# Patient Record
Sex: Female | Born: 1985 | Race: White | Hispanic: No | Marital: Married | Smoking: Never smoker
Health system: Southern US, Community
[De-identification: ages and names within clinical notes are randomized; demographics above are authoritative.]

## PROBLEM LIST (undated history)

## (undated) ENCOUNTER — Inpatient Hospital Stay (HOSPITAL_COMMUNITY): Payer: Self-pay

## (undated) DIAGNOSIS — B3731 Acute candidiasis of vulva and vagina: Secondary | ICD-10-CM

## (undated) DIAGNOSIS — B373 Candidiasis of vulva and vagina: Secondary | ICD-10-CM

## (undated) DIAGNOSIS — Z789 Other specified health status: Secondary | ICD-10-CM

## (undated) HISTORY — PX: BARIATRIC SURGERY: SHX1103

---

## 2011-05-04 HISTORY — PX: BARIATRIC SURGERY: SHX1103

## 2013-07-11 ENCOUNTER — Encounter: Payer: Self-pay | Admitting: *Deleted

## 2013-08-02 ENCOUNTER — Encounter: Payer: Self-pay | Admitting: Obstetrics & Gynecology

## 2013-08-02 ENCOUNTER — Ambulatory Visit (INDEPENDENT_AMBULATORY_CARE_PROVIDER_SITE_OTHER): Payer: BC Managed Care – PPO | Admitting: Obstetrics & Gynecology

## 2013-08-02 VITALS — BP 109/79 | Temp 98.1°F | Ht 60.0 in | Wt 142.2 lb

## 2013-08-02 DIAGNOSIS — Z3400 Encounter for supervision of normal first pregnancy, unspecified trimester: Secondary | ICD-10-CM | POA: Insufficient documentation

## 2013-08-02 DIAGNOSIS — O99840 Bariatric surgery status complicating pregnancy, unspecified trimester: Secondary | ICD-10-CM

## 2013-08-02 LAB — POCT URINALYSIS DIP (DEVICE)
Bilirubin Urine: NEGATIVE
Glucose, UA: NEGATIVE mg/dL
HGB URINE DIPSTICK: NEGATIVE
Ketones, ur: NEGATIVE mg/dL
Leukocytes, UA: NEGATIVE
NITRITE: NEGATIVE
Protein, ur: NEGATIVE mg/dL
Specific Gravity, Urine: 1.025 (ref 1.005–1.030)
UROBILINOGEN UA: 1 mg/dL (ref 0.0–1.0)
pH: 7 (ref 5.0–8.0)

## 2013-08-02 NOTE — Progress Notes (Signed)
Nutrition note: 1st visit consult Pt has h/o bariatric surgery in 2013 - pt reports she was 330# before surgery & her lowest was 130# prior to pregnancy. Pt stated she is not following any dietary restrictions except watches her sugar intake. Pt has gained 12.2# @ 2242w2d, which is > expected.  Pt reports eating 3 meals & 2 snacks/d. Pt is taking a PNV. Pt reports having some nausea but no heartburn. Pt is allergic to mangos. Pt stated MD plans to prescribe anti-nausea meds. Pt reports walking. Pt received verbal & written education on general nutrition during pregnancy. Provided handout on recommendations for micronutrients after bariatric surgery and pregnancy - discussed with patient that she may need an additional calcium supplement (recommendation is 1000-1500 mg/d), vitamin B12 (recommendation is 500 mcg/d), and iron (recommendation is 50-100 mg ferrous iron) - encouraged pt to talk to MD @ future visit about need for additional supplements.  Discussed wt gain goals of 15-25# or 0.6#/wk. Pt expressed concern about gaining wt back - encouraged pt to concentrate on 0.6#/wk, compare intake to recommendations for each food group, and how BF can help her lose wt after pregnancy. Pt agrees to continue taking PNV & monitor intake. Pt does not have WIC but plans [place] apply in [place]. Pt plans to BF. F/u in 4-6 wks Blondell RevealLaura Jozelyn Kuwahara, MS, RD, LDN, [facility]

## 2013-08-02 NOTE — Progress Notes (Signed)
P= 69 C/o of occasional cramping and N/V. Requests prescription for nausea. Discussed appropriate weight gain based on BMI (15-25lb); pt. Verbalized understanding.  New OB packet given. OTC list of appropriate medications given to pt.  New OB labs today. Pt. [facility] [place]  Pt. Declines flu today, information sheet given and pt. Will decide at next visit.

## 2013-08-02 NOTE — Progress Notes (Signed)
Maternal Fetal Medicine genetic counseling appointment scheduled for 08/03/13 at 10 am.  Release of information signed for [facility] [place] to get pap smear results.  [facility], patient did not have pap there.  Will need pap at next visit.

## 2013-08-03 ENCOUNTER — Ambulatory Visit (HOSPITAL_COMMUNITY)
Admission: RE | Admit: 2013-08-03 | Discharge: 2013-08-03 | Disposition: A | Discharge status: Home / Self Care | Payer: BC Managed Care – PPO | Source: Ambulatory Visit | Attending: Obstetrics & Gynecology | Admitting: Obstetrics & Gynecology

## 2013-08-03 DIAGNOSIS — O352XX0 Maternal care for (suspected) hereditary disease in fetus, not applicable or unspecified: Secondary | ICD-10-CM | POA: Insufficient documentation

## 2013-08-03 LAB — PRESCRIPTION MONITORING PROFILE (19 PANEL)
AMPHETAMINE/METH: NEGATIVE ng/mL
Barbiturate Screen, Urine: NEGATIVE ng/mL
Benzodiazepine Screen, Urine: NEGATIVE ng/mL
Buprenorphine, Urine: NEGATIVE ng/mL
CARISOPRODOL, URINE: NEGATIVE ng/mL
CREATININE, URINE: 165.42 mg/dL (ref >20.0)
Cannabinoid Scrn, Ur: NEGATIVE ng/mL
Cocaine Metabolites: NEGATIVE ng/mL
Fentanyl, Ur: NEGATIVE ng/mL
MDMA URINE: NEGATIVE ng/mL
METHADONE SCREEN, URINE: NEGATIVE ng/mL
Meperidine, Ur: NEGATIVE ng/mL
Methaqualone: NEGATIVE ng/mL
NITRITES URINE, INITIAL: NEGATIVE ug/mL
OXYCODONE SCRN UR: NEGATIVE ng/mL
Opiate Screen, Urine: NEGATIVE ng/mL
PROPOXYPHENE: NEGATIVE ng/mL
Phencyclidine, Ur: NEGATIVE ng/mL
TAPENTADOLUR: NEGATIVE ng/mL
Tramadol Scrn, Ur: NEGATIVE ng/mL
Zolpidem, Urine: NEGATIVE ng/mL
pH, Initial: 7.1 pH (ref 4.5–8.9)

## 2013-08-03 LAB — OBSTETRIC PANEL
Antibody Screen: NEGATIVE
BASOS PCT: 0 % (ref 0–1)
Basophils Absolute: 0 10*3/uL (ref 0.0–0.1)
EOS ABS: 0 10*3/uL (ref 0.0–0.7)
Eosinophils Relative: 0 % (ref 0–5)
HCT: 32.5 % — ABNORMAL LOW (ref 36.0–46.0)
HEP B S AG: NEGATIVE
Hemoglobin: 10.5 g/dL — ABNORMAL LOW (ref 12.0–15.0)
LYMPHS ABS: 1.6 10*3/uL (ref 0.7–4.0)
Lymphocytes Relative: 27 % (ref 12–46)
MCH: 23.3 pg — AB (ref 26.0–34.0)
MCHC: 32.3 g/dL (ref 30.0–36.0)
MCV: 72.1 fL — ABNORMAL LOW (ref 78.0–100.0)
Monocytes Absolute: 0.4 10*3/uL (ref 0.1–1.0)
Monocytes Relative: 7 % (ref 3–12)
NEUTROS ABS: 3.8 10*3/uL (ref 1.7–7.7)
NEUTROS PCT: 66 % (ref 43–77)
PLATELETS: 212 10*3/uL (ref 150–400)
RBC: 4.51 MIL/uL (ref 3.87–5.11)
RDW: 19.5 % — ABNORMAL HIGH (ref 11.5–15.5)
Rh Type: POSITIVE
Rubella: 0.54 Index (ref <0.90)
WBC: 5.8 10*3/uL (ref 4.0–10.5)

## 2013-08-03 LAB — GC/CHLAMYDIA PROBE AMP
CT Probe RNA: NEGATIVE
GC PROBE AMP APTIMA: NEGATIVE

## 2013-08-03 LAB — HIV ANTIBODY (ROUTINE TESTING W REFLEX): HIV: NONREACTIVE

## 2013-08-03 NOTE — Progress Notes (Addendum)
Genetic Counseling  High-Risk Gestation Note  Appointment Date:  08/03/2013 Referred By: Woodroe Mode, MD Date of Birth:  Mar 06, 1986 Partner:  [name]    Pregnancy History: G2P0010 Estimated Date of Delivery: 01/22/14 Estimated Gestational Age: [redacted]w[redacted]d I met with [name], [name] for genetic counseling regarding available screening options for fetal aneuploidy.  [name] inquired about noninvasive prenatal screening (NIPS)/cell free fetal DNA (cffDNA) testing.  We discussed that the [employer] recommends that this technology be made available to women who have an increased risk for fetal aneuploidy (AMA, abnormal maternal serum screening/First trimester screening, abnormal ultrasound findings, or a family history of aneuploidy).  Studies that have assessed the performance of this technology have been primarily performed on women who are considered to have a "high risk" for fetal aneuploidy.  While there are some newer studies that have assessed the performance of NIPS in the low risk population, this data is currently limited.    We reviewed that NIPS/cffDNA testing analyzes cell free DNA (from the placenta) in maternal circulation for the presence of extra or missing fetal chromosome material.   We reviewed the sensitivity, specificity, PPV, NPV and false positive rates of this technology.  They understand that while this testing is highly accurate, it is not considered to be diagnostic.  They were counseled regarding the expense of NIPS, and that approximately $3000 is billed to the insurance provider.  The amount they will be responsible for, out of pocket, depends upon the specific plan and is subject to co-pay, co-insurance and/or deductible.  We discussed that since [name] does not have an indication for NIPS, a generic screening code would have to be used as a billing code.  She understands that she would likely  have to pay out of pocket for this testing.    [name] maternal serum Quad screening.  We discussed that the preliminary result was available for review today and is screen negative for Down syndrome, trisomy 18, and ONTDs.  We reviewed this technology including the sensitivity, specificity, and false positive rates.  Given that the preliminary result was wnl, this couple declined further screening.  [name] encouraged to call her primary OB to review the final results of her Quad screen next week.    Both family histories were reviewed and found to be contributory for [name] apparently isolated cleft lip.  We discussed that in normal embryological development, the fetal lip usually closes by 7-[redacted] weeks gestation and the fetal palate usually closes by [redacted] weeks gestation.  When parts of these structures do not fuse properly, cleft lip and/or palate (CL/P) results.  CL/P is twice as common in males as it is in females. The incidence of CL/P varies in different ethnic populations; it occurs in approximately 1 in 180,000Caucasian births.  In addition to ethnicity, other factors may increase the chance of a CL/P including some prenatal exposures, alcohol and drug use, cigarette smoking, or folic acid deficiency.  They were counseled that CL/P is most often an isolated condition, but can be present in combination with other birth defects possibly as part of a genetic syndrome. When there is no syndrome as the cause, then the cleft lip or palate is typically suspected to be caused by a combination of genetic and environmental factors (multifactorial inheritance).  In this case, the risk of recurrence is expected to be ~3-4%.  We discussed the availability  of a detailed ultrasound at ~[redacted] weeks gestation for visualization of the fetal lip.  They understand that ultrasound cannot identify all birth defects or genetic conditions.      The family histories were otherwise found to be  noncontributory for birth defects, mental retardation, and known genetic conditions. Without further information regarding the provided family history, an accurate genetic risk cannot be calculated. Further genetic counseling is warranted if more information is obtained.  [name] was provided with written information regarding cystic fibrosis (CF) including the carrier frequency and incidence in the Caucasian population, the availability of carrier testing and prenatal diagnosis if indicated.  In addition, we discussed that CF is routinely screened for as part of the [place] newborn screening panel.  She declined testing today.   [name] denied exposure to environmental toxins or chemical agents. She denied the use of alcohol, tobacco or street drugs. She denied significant viral illnesses during the course of her pregnancy.   I counseled this couple regarding the above risks and available options.  The approximate face-to-face time with the genetic counselor was 38 minutes.  Filbert Schilder, Ms Certified Genetic Counselor

## 2013-08-04 LAB — CULTURE, OB URINE
Colony Count: NO GROWTH
Organism ID, Bacteria: NO GROWTH

## 2013-08-06 LAB — AFP, QUAD SCREEN
AFP: 45.8 IU/mL
Age Alone: 1:924 {titer}
Curr Gest Age: 15.2 wks.days
Down Syndrome Scr Risk Est: <1:38500 {titer}
HCG, Total: 33306 m[IU]/mL
INH: 214.4 pg/mL
Interpretation-AFP: NEGATIVE
MOM FOR AFP: 1.62
MoM for INH: 1.07
MoM for hCG: 1.14
Open Spina bifida: NEGATIVE
Tri 18 Scr Risk Est: NEGATIVE
UE3 VALUE: 0.6 ng/mL
uE3 Mom: 1.67

## 2013-08-30 ENCOUNTER — Encounter: Payer: Self-pay | Admitting: Family Medicine

## 2013-08-30 ENCOUNTER — Encounter: Payer: BC Managed Care – PPO | Admitting: Family Medicine

## 2013-09-13 ENCOUNTER — Ambulatory Visit (INDEPENDENT_AMBULATORY_CARE_PROVIDER_SITE_OTHER): Payer: BC Managed Care – PPO | Admitting: Family Medicine

## 2013-09-13 VITALS — BP 118/85 | HR 79 | Wt 146.1 lb

## 2013-09-13 DIAGNOSIS — O99840 Bariatric surgery status complicating pregnancy, unspecified trimester: Secondary | ICD-10-CM

## 2013-09-13 DIAGNOSIS — Z3400 Encounter for supervision of normal first pregnancy, unspecified trimester: Secondary | ICD-10-CM

## 2013-09-13 LAB — POCT URINALYSIS DIP (DEVICE)
BILIRUBIN URINE: NEGATIVE
GLUCOSE, UA: NEGATIVE mg/dL
Hgb urine dipstick: NEGATIVE
KETONES UR: NEGATIVE mg/dL
LEUKOCYTES UA: NEGATIVE
Nitrite: NEGATIVE
PH: 7 (ref 5.0–8.0)
Protein, ur: NEGATIVE mg/dL
Specific Gravity, Urine: 1.02 (ref 1.005–1.030)
Urobilinogen, UA: 0.2 mg/dL (ref 0.0–1.0)

## 2013-09-13 NOTE — Progress Notes (Signed)
   Subjective:    [name] is a G2P0010 seen 08/02/13 for her first obstetrical visit.  Her obstetrical history is significant for obesity, gastric bypass. Patient does intend to breast feed. Pregnancy history fully reviewed.  Patient reports no complaints.  Filed Vitals:   08/02/13 0945 08/02/13 0951  BP: 109/79   Temp: 98.1 F (36.7 C)   Height:  5' (1.524 m)  Weight: 142 lb 3.2 oz (64.501 kg)     HISTORY: OB History  Gravida Para Term Preterm AB SAB TAB Ectopic Multiple Living  2 0 0 0 1 0 1       # Outcome Date GA Lbr Len/2nd Weight Sex Delivery Anes PTL Lv  2 CUR           1 TAB 2000             History reviewed. No pertinent past medical history. Past Surgical History  Procedure Laterality Date  . Bariatric surgery    . Bariatric surgery N/A 2013    sleave   Family History  Problem Relation Age of Onset  . Hypertension Mother   . Hyperlipidemia Mother   . Hypertension Father   . Hyperlipidemia Father      Exam    Uterus:     Pelvic Exam:    Perineum: Normal Perineum   Vulva: normal   Vagina:  normal mucosa   pH:     Cervix: no lesions   Adnexa: not evaluated   Bony Pelvis: average  System: Breast:  normal appearance, no masses or tenderness   Skin: normal coloration and turgor, no rashes    Neurologic: oriented, normal   Extremities: normal strength, tone, and muscle mass   HEENT thyroid without masses   Mouth/Teeth dental hygiene good   Neck supple   Cardiovascular: regular rate and rhythm   Respiratory:  appears well, vitals normal, no respiratory distress, acyanotic, normal RR, neck free of mass or lymphadenopathy, chest clear, no wheezing, crepitations, rhonchi, normal symmetric air entry   Abdomen: soft, non-tender; bowel sounds normal; no masses,  no organomegaly   Urinary: urethral meatus normal      Assessment:    Pregnancy: G2P0010 Patient Active Problem List   Diagnosis Date Noted  . Hereditary disease in family  possibly affecting fetus, affecting management of mother, antepartum condition or complication 08/03/2013  . Supervision of low-risk first pregnancy 08/02/2013  . Bariatric surgery status in pregnancy, antepartum 08/02/2013        Plan:     Initial labs drawn. Prenatal vitamins. Problem list reviewed and updated. Genetic Screening and was scheduled 08/03/13 Ultrasound discussed; fetal survey: 18-20 weeks.  Follow up in 4 weeks.   Adam PhenixJames G Arnold

## 2013-09-13 NOTE — Progress Notes (Signed)
Needs anatomy u/s scheduled ASAP Pap smear today Consider SW referral VE-1 cm external os, closed internally, thick, high

## 2013-09-13 NOTE — Progress Notes (Signed)
U/S scheduled 09/18/13 at 11 am.

## 2013-09-13 NOTE — Addendum Note (Signed)
Addended by: Adam PhenixARNOLD, Chriss Mannan G on: 09/13/2013 12:01 PM   Modules accepted: Kipp BroodSmartSet

## 2013-09-13 NOTE — Progress Notes (Signed)
Patient complains of headaches. Needs Pap today.  Needs ultrasound.

## 2013-09-18 ENCOUNTER — Other Ambulatory Visit: Payer: Self-pay | Admitting: Family Medicine

## 2013-09-18 ENCOUNTER — Encounter: Payer: Self-pay | Admitting: Family Medicine

## 2013-09-18 ENCOUNTER — Ambulatory Visit (HOSPITAL_COMMUNITY)
Admission: RE | Admit: 2013-09-18 | Discharge: 2013-09-18 | Disposition: A | Discharge status: Home / Self Care | Payer: BC Managed Care – PPO | Source: Ambulatory Visit | Attending: Family Medicine | Admitting: Family Medicine

## 2013-09-18 DIAGNOSIS — Z3400 Encounter for supervision of normal first pregnancy, unspecified trimester: Secondary | ICD-10-CM

## 2013-09-18 DIAGNOSIS — Z3689 Encounter for other specified antenatal screening: Secondary | ICD-10-CM | POA: Insufficient documentation

## 2013-09-18 IMAGING — US US OB COMP +14 WK
1 series · 12 of 28 positions shown · non-contrast
Comparison: none

[Series 1: us ob comp +14 wk · 12 of 121 slices shown]
[im 5/121]
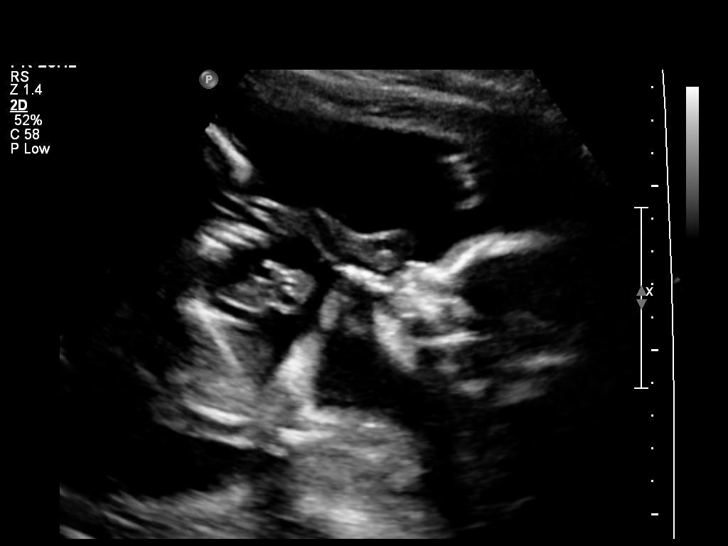
[im 14/121]
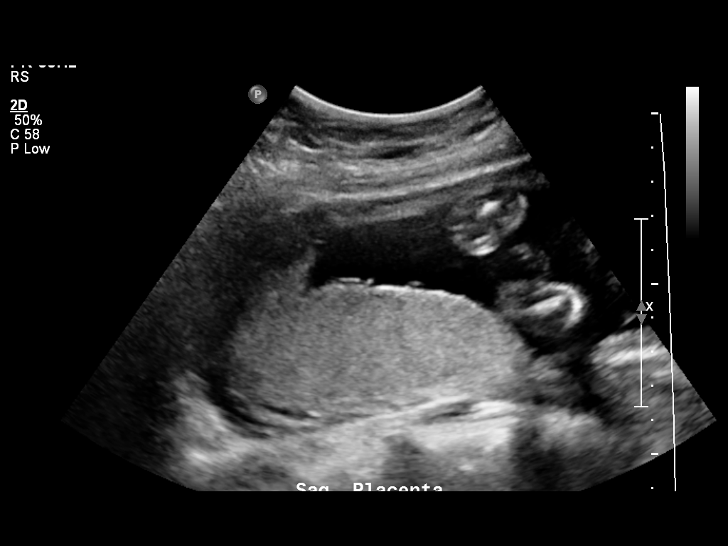
[im 23/121]
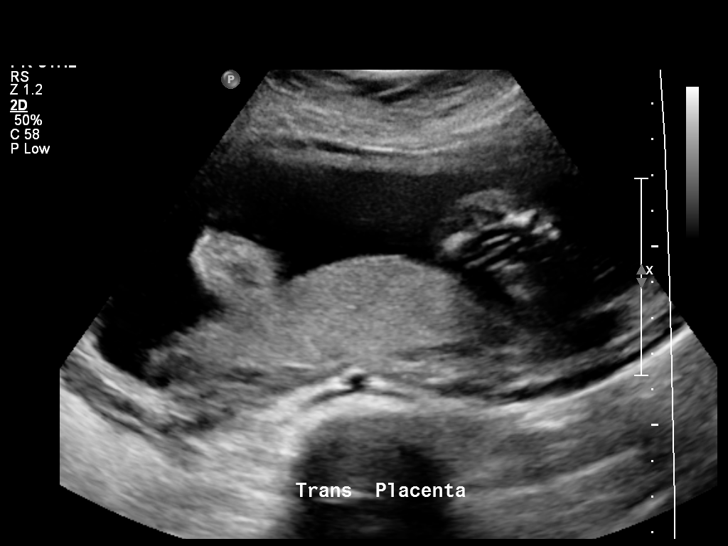
[im 36/121]
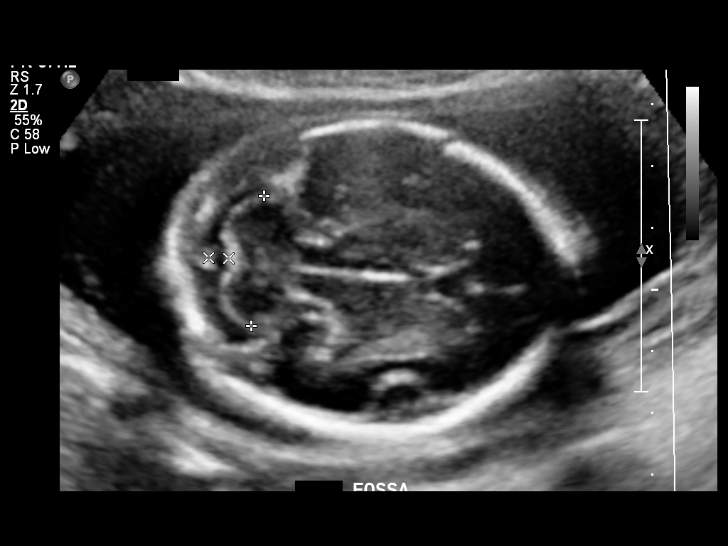
[im 45/121]
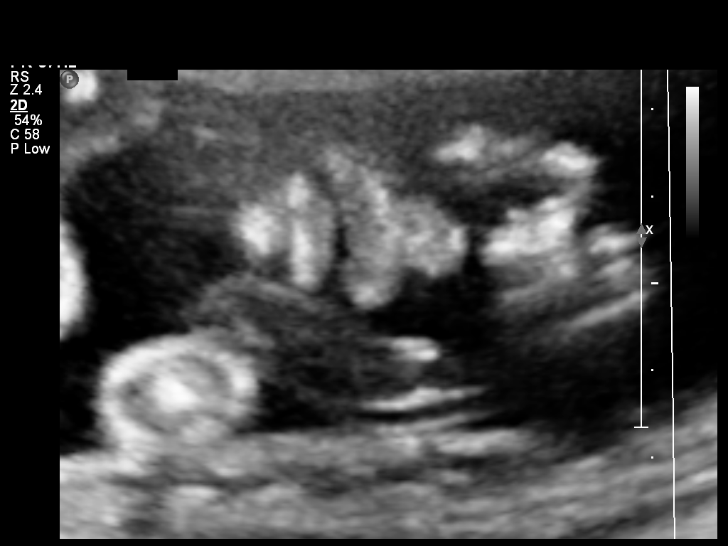
[im 54/121]
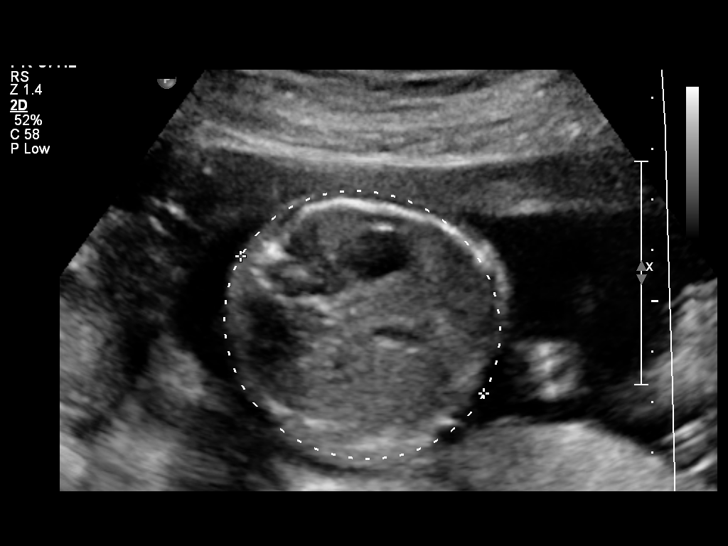
[im 67/121]
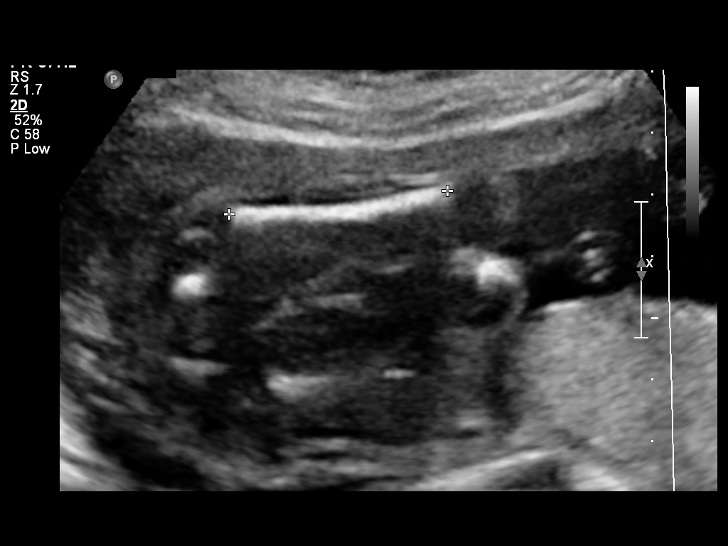
[im 76/121]
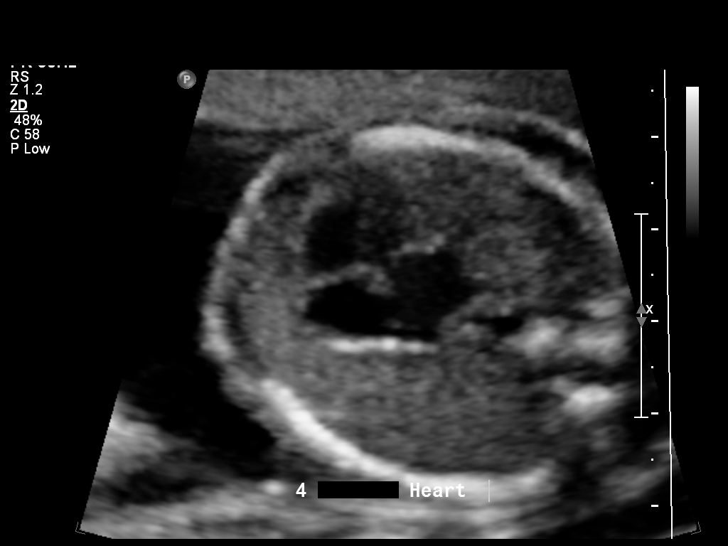
[im 85/121]
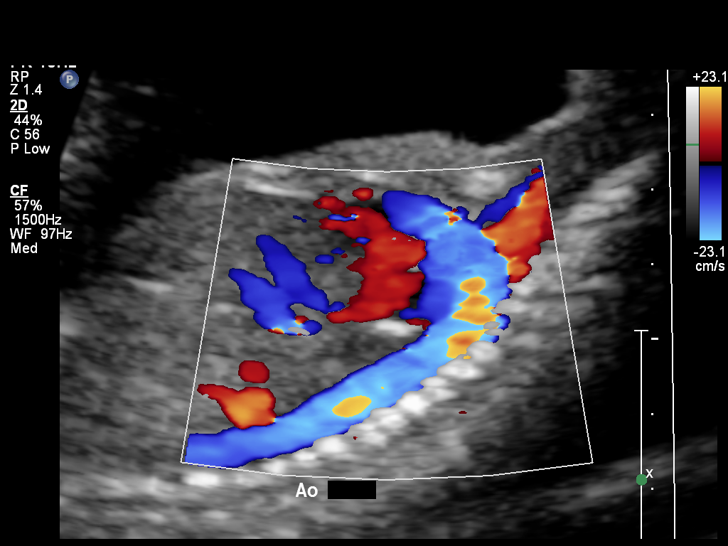
[im 98/121]
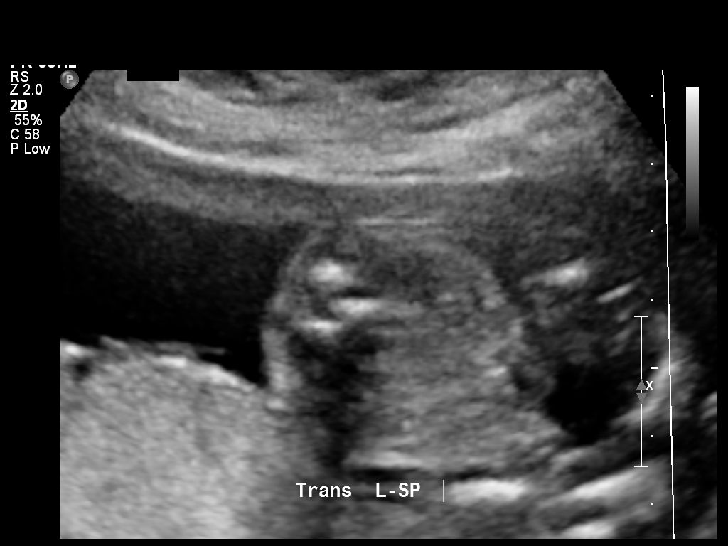
[im 107/121]
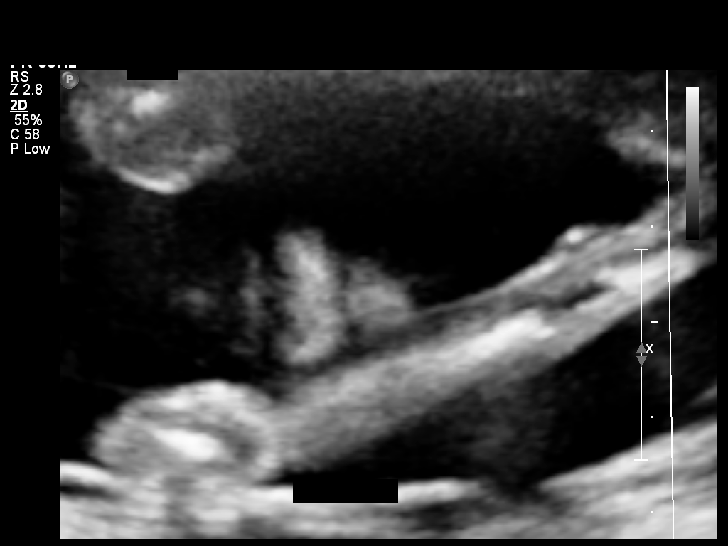
[im 116/121]
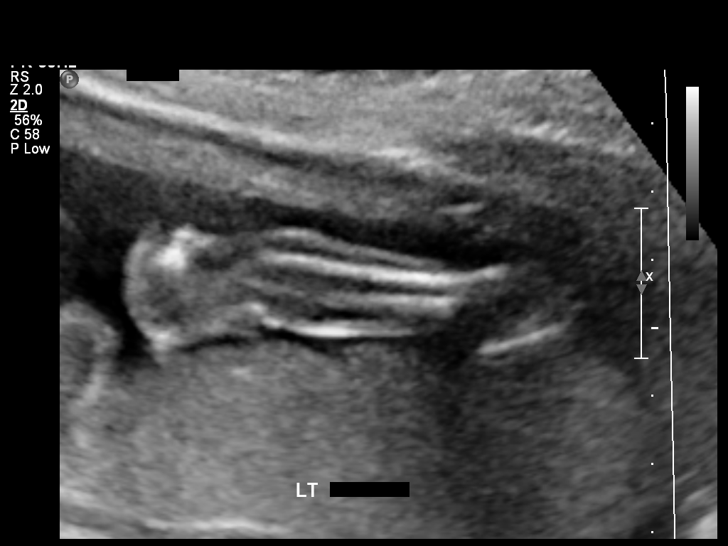

[12 of 28 positions shown; findings below may reference images not displayed]

OBSTETRICS REPORT
                      (Signed Final [DATE] [DATE])

Service(s) Provided

 US OB COMP + 14 WK                                    76805.1
Indications

 Basic anatomic survey                                 [QS]
Fetal Evaluation

 Num Of Fetuses:    1
 Fetal Heart Rate:  140                          bpm
 Cardiac Activity:  Observed
 Presentation:      Cephalic
 Placenta:          Fundal, above cervical os
 P. Cord            Visualized, central
 Insertion:

 Amniotic Fluid
 AFI FV:      Subjectively within normal limits
                                             Larg Pckt:     3.9  cm
Biometry

 BPD:     49.4  mm     G. Age:  20w 6d                CI:        69.78   70 - 86
                                                      FL/HC:      18.8   15.9 -

 HC:     188.7  mm     G. Age:  21w 1d       42  %    HC/AC:      1.15   1.06 -

 AC:     164.4  mm     G. Age:  21w 4d       55  %    FL/BPD:
 FL:      35.5  mm     G. Age:  21w 1d       44  %    FL/AC:      21.6   20 - 24
 HUM:     33.2  mm     G. Age:  21w 1d       49  %
 CER:     21.1  mm     G. Age:  20w 1d       25  %

 Est. FW:     416  gm    0 lb 15 oz      49  %
Gestational Age

 LMP:           22w 0d        Date:  [DATE]                 EDD:   [DATE]
 U/S Today:     21w 1d                                        EDD:   [DATE]
 Best:          21w 1d     Det. By:  U/S ([DATE])           EDD:   [DATE]
Anatomy

 Cranium:          Appears normal         Aortic Arch:      Not well visualized
 Fetal Cavum:      Appears normal         Ductal Arch:      Not well visualized
 Ventricles:       Appears normal         Diaphragm:        Appears normal
 Choroid Plexus:   Appears normal         Stomach:          Appears normal, left
                                                            sided
 Cerebellum:       Appears normal         Abdomen:          Appears normal
 Posterior Fossa:  Appears normal         Abdominal Wall:   Appears nml (cord
                                                            insert, abd wall)
 Nuchal Fold:      Not applicable (>20    Cord Vessels:     Appears normal (3
                   wks GA)                                  vessel cord)
 Face:             Appears normal         Kidneys:          Appear normal
                   (orbits and profile)
 Lips:             Appears normal         Bladder:          Appears normal
 Heart:            Appears normal         Spine:            Appears normal
                   (4CH, axis, and
                   situs)
 RVOT:             Appears normal         Lower             Appears normal
                                          Extremities:
 LVOT:             Appears normal         Upper             Appears normal
                                          Extremities:

 Other:  Fetus appears to be a female. Nasal bone visualized. Heels and 5th
         digit visualized.
Targeted Anatomy

 Fetal Central Nervous System
 Lat. Ventricles:  4.1                    Cisterna Magna:
Cervix Uterus Adnexa

 Cervical Length:    4.03     cm

 Cervix:       Normal appearance by transabdominal scan.
 Uterus:       No abnormality visualized.
 Left Ovary:    Within normal limits.
 Right Ovary:   Within normal limits.

 Adnexa:     No abnormality visualized.
Impression

 SIUP at 21+1 weeks
 Normal detailed fetal anatomy; limited views of arches
 Markers of aneuploidy: none
 Normal amniotic fluid volume
 Measurements consistent with prior US
Recommendations

 Follow-up as clinically indicated

## 2013-09-26 ENCOUNTER — Inpatient Hospital Stay (HOSPITAL_COMMUNITY)
Admission: AD | Admit: 2013-09-26 | Discharge: 2013-09-26 | Disposition: A | Discharge status: Home / Self Care | Payer: BC Managed Care – PPO | Source: Ambulatory Visit | Attending: Family Medicine | Admitting: Family Medicine

## 2013-09-26 ENCOUNTER — Encounter (HOSPITAL_COMMUNITY): Payer: Self-pay | Admitting: *Deleted

## 2013-09-26 DIAGNOSIS — Z3689 Encounter for other specified antenatal screening: Secondary | ICD-10-CM

## 2013-09-26 DIAGNOSIS — Z3492 Encounter for supervision of normal pregnancy, unspecified, second trimester: Secondary | ICD-10-CM

## 2013-09-26 DIAGNOSIS — O368190 Decreased fetal movements, unspecified trimester, not applicable or unspecified: Secondary | ICD-10-CM | POA: Insufficient documentation

## 2013-09-26 DIAGNOSIS — Z36 Encounter for antenatal screening of mother: Secondary | ICD-10-CM

## 2013-09-26 HISTORY — DX: Other specified health status: Z78.9

## 2013-09-26 NOTE — Discharge Instructions (Signed)
Second Trimester of Pregnancy The second trimester is from week 13 through week 28, months 4 through 6. The second trimester is often a time when you feel your best. Your body has also adjusted to being pregnant, and you begin to feel better physically. Usually, morning sickness has lessened or quit completely, you may have more energy, and you may have an increase in appetite. The second trimester is also a time when the fetus is growing rapidly. At the end of the sixth month, the fetus is about 9 inches long and weighs about 1 pounds. You will likely begin to feel the baby move (quickening) between 18 and 20 weeks of the pregnancy. BODY CHANGES Your body goes through many changes during pregnancy. The changes vary from woman to woman.   Your weight will continue to increase. You will notice your lower abdomen bulging out.  You may begin to get stretch marks on your hips, abdomen, and breasts.  You may develop headaches that can be relieved by medicines approved by your caregiver.  You may urinate more often because the fetus is pressing on your bladder.  You may develop or continue to have heartburn as a result of your pregnancy.  You may develop constipation because certain hormones are causing the muscles that push waste through your intestines to slow down.  You may develop hemorrhoids or swollen, bulging veins (varicose veins).  You may have back pain because of the weight gain and pregnancy hormones relaxing your joints between the bones in your pelvis and as a result of a shift in weight and the muscles that support your balance.  Your breasts will continue to grow and be tender.  Your gums may bleed and may be sensitive to brushing and flossing.  Dark spots or blotches (chloasma, mask of pregnancy) may develop on your face. This will likely fade after the baby is born.  A dark line from your belly button to the pubic area (linea nigra) may appear. This will likely fade after the  baby is born. WHAT TO EXPECT AT YOUR PRENATAL VISITS During a routine prenatal visit:  You will be weighed to make sure you and the fetus are growing normally.  Your blood pressure will be taken.  Your abdomen will be measured to track your baby's growth.  The fetal heartbeat will be listened to.  Any test results from the previous visit will be discussed. Your caregiver may ask you:  How you are feeling.  If you are feeling the baby move.  If you have had any abnormal symptoms, such as leaking fluid, bleeding, severe headaches, or abdominal cramping.  If you have any questions. Other tests that may be performed during your second trimester include:  Blood tests that check for:  Low iron levels (anemia).  Gestational diabetes (between 24 and 28 weeks).  Rh antibodies.  Urine tests to check for infections, diabetes, or protein in the urine.  An ultrasound to confirm the proper growth and development of the baby.  An amniocentesis to check for possible genetic problems.  Fetal screens for spina bifida and Down syndrome. HOME CARE INSTRUCTIONS   Avoid all smoking, herbs, alcohol, and unprescribed drugs. These chemicals affect the formation and growth of the baby.  Follow your caregiver's instructions regarding medicine use. There are medicines that are either safe or unsafe to take during pregnancy.  Exercise only as directed by your caregiver. Experiencing uterine cramps is a good sign to stop exercising.  Continue to eat regular,   healthy meals.  Wear a good support bra for breast tenderness.  Do not use hot tubs, steam rooms, or saunas.  Wear your seat belt at all times when driving.  Avoid raw meat, uncooked cheese, cat litter boxes, and soil used by cats. These carry germs that can cause birth defects in the baby.  Take your prenatal vitamins.  Try taking a stool softener (if your caregiver approves) if you develop constipation. Eat more high-fiber foods,  such as fresh vegetables or fruit and whole grains. Drink plenty of fluids to keep your urine clear or pale yellow.  Take warm sitz baths to soothe any pain or discomfort caused by hemorrhoids. Use hemorrhoid cream if your caregiver approves.  If you develop varicose veins, wear support hose. Elevate your feet for 15 minutes, 3 4 times a day. Limit salt in your diet.  Avoid heavy lifting, wear low heel shoes, and practice good posture.  Rest with your legs elevated if you have leg cramps or low back pain.  Visit your dentist if you have not gone yet during your pregnancy. Use a soft toothbrush to brush your teeth and be gentle when you floss.  A sexual relationship may be continued unless your caregiver directs you otherwise.  Continue to go to all your prenatal visits as directed by your caregiver. SEEK MEDICAL CARE IF:   You have dizziness.  You have mild pelvic cramps, pelvic pressure, or nagging pain in the abdominal area.  You have persistent nausea, vomiting, or diarrhea.  You have a bad smelling vaginal discharge.  You have pain with urination. SEEK IMMEDIATE MEDICAL CARE IF:   You have a fever.  You are leaking fluid from your vagina.  You have spotting or bleeding from your vagina.  You have severe abdominal cramping or pain.  You have rapid weight gain or loss.  You have shortness of breath with chest pain.  You notice sudden or extreme swelling of your face, hands, ankles, feet, or legs.  You have not felt your baby move in over an hour.  You have severe headaches that do not go away with medicine.  You have vision changes. Document Released: 04/13/2001 Document Revised: 12/20/2012 Document Reviewed: 06/20/2012 Exit[employer] Patient Information 2014 [employer].  

## 2013-09-26 NOTE — MAU Note (Signed)
Patient states she has been feeling fetal movement but has not felt anything in a couple of hours. Has an "empty feeling" in stomach.FHR in 160. Denies pain, bleeding or leaking fluid.

## 2013-09-26 NOTE — MAU Provider Note (Signed)
Chief Complaint:  Decreased Fetal Movement   [name] is a 28 y.o.  G2P0010 with IUP at [redacted]w[redacted]d presenting for Decreased Fetal Movement This morning she didn't feel the baby moving around normally. She denies any LOF or VB. Her obstetrical history is significant for obesity, gastric bypass. Since arriving here her baby is moving normally again.     Menstrual History: OB History   Grav Para Term Preterm Abortions TAB SAB Ect Mult Living   2 0 0 0 1 1 0         Patient's last menstrual period was 04/17/2013.      Past Medical History  Diagnosis Date  . Medical history non-contributory     Past Surgical History  Procedure Laterality Date  . Bariatric surgery    . Bariatric surgery N/A 2013    sleave    Family History  Problem Relation Age of Onset  . Hypertension Mother   . Hyperlipidemia Mother   . Hypertension Father   . Hyperlipidemia Father     History  Substance Use Topics  . Smoking status: Never Smoker   . Smokeless tobacco: Never Used  . Alcohol Use: No      Allergies  Allergen Reactions  . Penicillins Anaphylaxis    Prescriptions prior to admission  Medication Sig Dispense Refill  . calcium carbonate (TUMS - DOSED IN MG ELEMENTAL CALCIUM) 500 MG chewable tablet Chew 1 tablet by mouth daily.      . Prenatal Vit-Fe Fumarate-FA (MULTIVITAMIN-PRENATAL) 27-0.8 MG TABS tablet Take 1 tablet by mouth daily at 12 noon.        Review of Systems - Negative except for what is mentioned in HPI.  Physical Exam  Blood pressure 117/59, pulse 64, temperature 98 F (36.7 C), temperature source Oral, resp. rate 16, height 5' 0.5" (1.537 m), weight 67.949 kg (149 lb 12.8 oz), last menstrual period 04/17/2013, SpO2 100.00%. GENERAL: Well-developed, well-nourished female in no acute distress.  LUNGS: Clear to auscultation bilaterally.  HEART: Regular rate and rhythm. ABDOMEN: Soft, nontender, nondistended, gravid.  EXTREMITIES: Nontender, no edema, 2+  distal pulses. FHT:  Baseline rate 150 bpm   Variability moderate   Decelerations none Contractions: none   Labs: No results found for this or any previous visit (from the past 24 hour(s)).  Imaging Studies:  US Ob Comp + 14 Wk  09/18/2013   OBSTETRICAL ULTRASOUND: This exam was performed within a [facility]. The OB US report was generated in the [facility] system, and faxed to the ordering physician.   This report is also available in [employer] and in the [employer]. See [facility] Obstetric US report.   Assessment: [name] is  28 y.o. G2P0010 at [redacted]w[redacted]d presents with Decreased Fetal Movement 1. NST reactive   Plan: Discussed with Dr. Reola Calkins   #Decreased fetal movement: NST was appropriate for gestational age. She felt like the baby was moving normally after eating and drinking in the MAU.  - f/u as scheduled.  - given return precautions.   Myra Rude 5/27/20152:27 PM  I reviewed the above documentation in the resident's note and agree.   Rulon Abide, M.D. [facility] Fellow 09/26/2013 6:02 PM

## 2013-09-27 NOTE — MAU Provider Note (Signed)
Attestation of Attending Supervision of Advanced Practitioner (PA/CNM/NP): Evaluation and management procedures were performed by the Advanced Practitioner under my supervision and collaboration.  I have reviewed the Advanced Practitioner's note and chart, and I agree with the management and plan.  Ollie Esty S Lorrine Killilea, MD [facility] Attending 09/27/2013 8:22 AM   

## 2013-09-28 ENCOUNTER — Encounter: Payer: Self-pay | Admitting: General Practice

## 2013-10-11 ENCOUNTER — Encounter: Payer: Self-pay | Admitting: Obstetrics and Gynecology

## 2013-10-11 ENCOUNTER — Ambulatory Visit (INDEPENDENT_AMBULATORY_CARE_PROVIDER_SITE_OTHER): Payer: BC Managed Care – PPO | Admitting: Obstetrics and Gynecology

## 2013-10-11 VITALS — BP 114/78 | HR 71 | Temp 97.2°F | Wt 153.0 lb

## 2013-10-11 DIAGNOSIS — O352XX0 Maternal care for (suspected) hereditary disease in fetus, not applicable or unspecified: Secondary | ICD-10-CM

## 2013-10-11 DIAGNOSIS — O99840 Bariatric surgery status complicating pregnancy, unspecified trimester: Secondary | ICD-10-CM

## 2013-10-11 DIAGNOSIS — Z3400 Encounter for supervision of normal first pregnancy, unspecified trimester: Secondary | ICD-10-CM

## 2013-10-11 LAB — POCT URINALYSIS DIP (DEVICE)
Bilirubin Urine: NEGATIVE
Glucose, UA: NEGATIVE mg/dL
Hgb urine dipstick: NEGATIVE
Ketones, ur: NEGATIVE mg/dL
LEUKOCYTES UA: NEGATIVE
NITRITE: NEGATIVE
PROTEIN: NEGATIVE mg/dL
Specific Gravity, Urine: 1.02 (ref 1.005–1.030)
Urobilinogen, UA: 0.2 mg/dL (ref 0.0–1.0)
pH: 7.5 (ref 5.0–8.0)

## 2013-10-11 NOTE — Progress Notes (Signed)
Patient is doing well without complaint. FM/PTL precautions reviewed. 1hr GCT and labs next visit.

## 2013-10-11 NOTE — Progress Notes (Signed)
Reports occasional cramping.  

## 2013-10-18 ENCOUNTER — Other Ambulatory Visit: Payer: BC Managed Care – PPO

## 2013-10-18 DIAGNOSIS — Z3400 Encounter for supervision of normal first pregnancy, unspecified trimester: Secondary | ICD-10-CM

## 2013-10-23 ENCOUNTER — Other Ambulatory Visit: Payer: BC Managed Care – PPO

## 2013-10-23 DIAGNOSIS — Z3481 Encounter for supervision of other normal pregnancy, first trimester: Secondary | ICD-10-CM

## 2013-10-24 LAB — GLUCOSE TOLERANCE, 1 HOUR (50G) W/O FASTING: Glucose, 1 Hour GTT: 84 mg/dL (ref 70–140)

## 2013-11-01 ENCOUNTER — Ambulatory Visit (INDEPENDENT_AMBULATORY_CARE_PROVIDER_SITE_OTHER): Payer: BC Managed Care – PPO | Admitting: Obstetrics and Gynecology

## 2013-11-01 ENCOUNTER — Encounter: Payer: Self-pay | Admitting: Obstetrics and Gynecology

## 2013-11-01 ENCOUNTER — Encounter: Payer: BC Managed Care – PPO | Admitting: Family Medicine

## 2013-11-01 VITALS — BP 115/75 | HR 82 | Temp 97.3°F

## 2013-11-01 DIAGNOSIS — Z23 Encounter for immunization: Secondary | ICD-10-CM

## 2013-11-01 DIAGNOSIS — Z3403 Encounter for supervision of normal first pregnancy, third trimester: Secondary | ICD-10-CM

## 2013-11-01 DIAGNOSIS — O99840 Bariatric surgery status complicating pregnancy, unspecified trimester: Secondary | ICD-10-CM

## 2013-11-01 DIAGNOSIS — O99843 Bariatric surgery status complicating pregnancy, third trimester: Secondary | ICD-10-CM

## 2013-11-01 DIAGNOSIS — Z3400 Encounter for supervision of normal first pregnancy, unspecified trimester: Secondary | ICD-10-CM

## 2013-11-01 LAB — POCT URINALYSIS DIP (DEVICE)
Bilirubin Urine: NEGATIVE
Glucose, UA: 100 mg/dL — AB
Ketones, ur: NEGATIVE mg/dL
Nitrite: NEGATIVE
PH: 7 (ref 5.0–8.0)
Protein, ur: NEGATIVE mg/dL
Specific Gravity, Urine: 1.015 (ref 1.005–1.030)
UROBILINOGEN UA: 1 mg/dL (ref 0.0–1.0)

## 2013-11-01 LAB — CBC
HCT: 29.1 % — ABNORMAL LOW (ref 36.0–46.0)
Hemoglobin: 9.5 g/dL — ABNORMAL LOW (ref 12.0–15.0)
MCH: 24.8 pg — ABNORMAL LOW (ref 26.0–34.0)
MCHC: 32.6 g/dL (ref 30.0–36.0)
MCV: 76 fL — AB (ref 78.0–100.0)
PLATELETS: 179 10*3/uL (ref 150–400)
RBC: 3.83 MIL/uL — ABNORMAL LOW (ref 3.87–5.11)
RDW: 16.1 % — AB (ref 11.5–15.5)
WBC: 6.4 10*3/uL (ref 4.0–10.5)

## 2013-11-01 MED ORDER — TETANUS-DIPHTH-ACELL PERTUSSIS 5-2.5-18.5 LF-MCG/0.5 IM SUSP
0.5000 mL | Freq: Once | INTRAMUSCULAR | Status: AC
  Administered 2013-11-01: 0.5 mL via INTRAMUSCULAR

## 2013-11-01 NOTE — Progress Notes (Signed)
Edema-legs

## 2013-11-01 NOTE — Progress Notes (Signed)
Patient is doing well without complaints. FM/PTL precautions reviewed.  

## 2013-11-02 LAB — HIV ANTIBODY (ROUTINE TESTING W REFLEX): HIV 1&2 Ab, 4th Generation: NONREACTIVE

## 2013-11-02 LAB — RPR

## 2013-11-15 ENCOUNTER — Inpatient Hospital Stay (HOSPITAL_COMMUNITY)
Admission: AD | Admit: 2013-11-15 | Discharge: 2013-11-15 | Disposition: A | Discharge status: Home / Self Care | Payer: BC Managed Care – PPO | Source: Ambulatory Visit | Attending: Obstetrics and Gynecology | Admitting: Obstetrics and Gynecology

## 2013-11-15 ENCOUNTER — Encounter: Payer: BC Managed Care – PPO | Admitting: Family Medicine

## 2013-11-15 ENCOUNTER — Encounter (HOSPITAL_COMMUNITY): Payer: Self-pay | Admitting: *Deleted

## 2013-11-15 DIAGNOSIS — O368190 Decreased fetal movements, unspecified trimester, not applicable or unspecified: Secondary | ICD-10-CM

## 2013-11-15 DIAGNOSIS — Z3689 Encounter for other specified antenatal screening: Secondary | ICD-10-CM

## 2013-11-15 DIAGNOSIS — O3090 Multiple gestation, unspecified, unspecified trimester: Secondary | ICD-10-CM

## 2013-11-15 DIAGNOSIS — O368131 Decreased fetal movements, third trimester, fetus 1: Secondary | ICD-10-CM

## 2013-11-15 NOTE — MAU Note (Signed)
Pt reports she has not felt her baby move much in the past 24 hrs. Had some cramping yesterday none now

## 2013-11-15 NOTE — Progress Notes (Signed)
Pt states that she now feels baby moving. Fetal movement observed by RN.

## 2013-11-15 NOTE — MAU Note (Signed)
Patient states that she has continued to feel baby move throughout observation time.

## 2013-11-15 NOTE — MAU Provider Note (Signed)
Chief Complaint:  Decreased Fetal Movement   First Provider Initiated Contact with Patient 11/15/13 1546      HPI: [name] is a 28 y.o. G2P0010 at 4797w2d who presents to maternity admissions reporting decreased fetal movement.  She felt movement yesterday, but has felt no movement since this morning, despite caffeine and fluid intake and food.  She does report feeling movement upon arrival in MAU after monitors applied.  She denies abdominal pain, LOF, vaginal bleeding, vaginal itching/burning, urinary symptoms, h/a, dizziness, n/v, or fever/chills.    Past Medical History: Past Medical History  Diagnosis Date  . Medical history non-contributory     Past obstetric history: OB History  Gravida Para Term Preterm AB SAB TAB Ectopic Multiple Living  2 0 0 0 1 0 1       # Outcome Date GA Lbr Len/2nd Weight Sex Delivery Anes PTL Lv  2 CUR           1 TAB 2000              Past Surgical History: Past Surgical History  Procedure Laterality Date  . Bariatric surgery    . Bariatric surgery N/A 2013    sleave    Family History: Family History  Problem Relation Age of Onset  . Hypertension Mother   . Hyperlipidemia Mother   . Hypertension Father   . Hyperlipidemia Father     Social History: History  Substance Use Topics  . Smoking status: Never Smoker   . Smokeless tobacco: Never Used  . Alcohol Use: No    Allergies:  Allergies  Allergen Reactions  . Fruit Extracts Anaphylaxis    MANGO  Reports anaphylaxis   . Penicillins Anaphylaxis    Meds:  No prescriptions prior to admission    ROS: Pertinent findings in history of present illness.  Physical Exam  Blood pressure 114/67, pulse 68, temperature 98 F (36.7 C), temperature source Oral, resp. rate 18, height 5' (1.524 m), weight 73.483 kg (162 lb), last menstrual period 04/17/2013. GENERAL: Well-developed, well-nourished female in no acute distress.  HEENT: normocephalic HEART: normal  rate RESP: normal effort ABDOMEN: Soft, non-tender, gravid appropriate for gestational age EXTREMITIES: Nontender, no edema NEURO: alert and oriented    FHT:  Baseline 145 moderate variability, accelerations present, no decelerations Contractions: None on toco or to palpation    Assessment: 1. Decreased fetal movement in pregnancy, antepartum, third trimester, fetus 1   2. NST (non-stress test) reactive     Plan: Discharge home Fetal kick counts Reassurance provided about reactive NST      Follow-up Information   [facility]   Specialty:  Obstetrics and Gynecology   Contact information:   [address] [place] [address] [phone]      [facility]. (As needed for emergencies)    Contact information:   [address] [id number] [address] [address] [phone]       Medication List         calcium carbonate 500 MG chewable tablet  Commonly known as:  TUMS - dosed in mg elemental calcium  Chew 1 tablet by mouth daily.     multivitamin-prenatal 27-0.8 MG Tabs tablet  Take 1 tablet by mouth daily at 12 noon.        Sharen CounterLisa Leftwich-Kirby Certified Nurse-Midwife 11/15/2013 7:11 PM

## 2013-11-15 NOTE — Discharge Instructions (Signed)
Fetal Movement Counts Patient Name: __________________________________________________ Patient Due Date: ____________________ Performing a fetal movement count is highly recommended in high-risk pregnancies, but it is good for every pregnant woman to do. Your caregiver may ask you to start counting fetal movements at 28 weeks of the pregnancy. Fetal movements often increase:  After eating a full meal.  After physical activity.  After eating or drinking something sweet or cold.  At rest. Pay attention to when you feel the baby is most active. This will help you notice a pattern of your baby's sleep and wake cycles and what factors contribute to an increase in fetal movement. It is important to perform a fetal movement count at the same time each day when your baby is normally most active.  HOW TO COUNT FETAL MOVEMENTS 1. Find a quiet and comfortable area to sit or lie down on your left side. Lying on your left side provides the best blood and oxygen circulation to your baby. 2. Write down the day and time on a sheet of paper or in a journal. 3. Start counting kicks, flutters, swishes, rolls, or jabs in a 2 hour period. You should feel at least 10 movements within 2 hours. 4. If you do not feel 10 movements in 2 hours, wait 2-3 hours and count again. Look for a change in the pattern or not enough counts in 2 hours. SEEK MEDICAL CARE IF:  You feel less than 10 counts in 2 hours, tried twice.  There is no movement in over an hour.  The pattern is changing or taking longer each day to reach 10 counts in 2 hours.  You feel the baby is not moving as he or she usually does. Date: ____________ Movements: ____________ Start time: ____________ Doreatha MartinFinish time: ____________  Date: ____________ Movements: ____________ Start time: ____________ Doreatha MartinFinish time: ____________ Date: ____________ Movements: ____________ Start time: ____________ Doreatha MartinFinish time: ____________ Date: ____________ Movements: ____________  Start time: ____________ Doreatha MartinFinish time: ____________ Date: ____________ Movements: ____________ Start time: ____________ Doreatha MartinFinish time: ____________ Date: ____________ Movements: ____________ Start time: ____________ Doreatha MartinFinish time: ____________ Date: ____________ Movements: ____________ Start time: ____________ Doreatha MartinFinish time: ____________ Date: ____________ Movements: ____________ Start time: ____________ Doreatha MartinFinish time: ____________  Document Released: 05/19/2006 Document Revised: 04/05/2012 Document Reviewed: 02/14/2012 ExitCare Patient Information 2015 [employer]. This information is not intended to replace advice given to you by your health care provider. Make sure you discuss any questions you have with your health care provider.

## 2013-11-16 NOTE — MAU Provider Note (Signed)
Attestation of Attending Supervision of Advanced Practitioner (CNM/NP): Evaluation and management procedures were performed by the Advanced Practitioner under my supervision and collaboration.  I have reviewed the Advanced Practitioner's note and chart, and I agree with the management and plan.  Chau Savell 11/16/2013 8:57 AM

## 2013-11-23 ENCOUNTER — Encounter: Payer: Self-pay | Admitting: General Practice

## 2013-11-29 ENCOUNTER — Ambulatory Visit (INDEPENDENT_AMBULATORY_CARE_PROVIDER_SITE_OTHER): Payer: BC Managed Care – PPO | Admitting: Obstetrics & Gynecology

## 2013-11-29 VITALS — BP 110/70 | HR 78 | Temp 97.9°F | Wt 159.7 lb

## 2013-11-29 DIAGNOSIS — R8271 Bacteriuria: Secondary | ICD-10-CM

## 2013-11-29 DIAGNOSIS — O9989 Other specified diseases and conditions complicating pregnancy, childbirth and the puerperium: Secondary | ICD-10-CM

## 2013-11-29 DIAGNOSIS — Z3403 Encounter for supervision of normal first pregnancy, third trimester: Secondary | ICD-10-CM

## 2013-11-29 DIAGNOSIS — O99891 Other specified diseases and conditions complicating pregnancy: Secondary | ICD-10-CM

## 2013-11-29 DIAGNOSIS — B373 Candidiasis of vulva and vagina: Secondary | ICD-10-CM

## 2013-11-29 DIAGNOSIS — Z3400 Encounter for supervision of normal first pregnancy, unspecified trimester: Secondary | ICD-10-CM

## 2013-11-29 DIAGNOSIS — B3731 Acute candidiasis of vulva and vagina: Secondary | ICD-10-CM

## 2013-11-29 LAB — POCT URINALYSIS DIP (DEVICE)
GLUCOSE, UA: NEGATIVE mg/dL
NITRITE: NEGATIVE
PROTEIN: 100 mg/dL — AB
Specific Gravity, Urine: 1.02 (ref 1.005–1.030)
UROBILINOGEN UA: 1 mg/dL (ref 0.0–1.0)
pH: 6.5 (ref 5.0–8.0)

## 2013-11-29 MED ORDER — FLUCONAZOLE 150 MG PO TABS: 150.0000 mg | ORAL_TABLET | Freq: Once | ORAL | Status: DC

## 2013-11-29 NOTE — Progress Notes (Signed)
C/o of thick white discharge and vaginal itching-- wet prep today.  Occasional edema in feet-- discussed elevation of feet when it occurs.

## 2013-11-29 NOTE — Progress Notes (Signed)
Discharge and itch, c/w yeast wet prep sent today and urine culture  Diflucan 150 mg single dose

## 2013-11-30 LAB — WET PREP, GENITAL
Clue Cells Wet Prep HPF POC: NONE SEEN
TRICH WET PREP: NONE SEEN
WBC, Wet Prep HPF POC: NONE SEEN

## 2013-12-02 LAB — CULTURE, OB URINE

## 2013-12-13 ENCOUNTER — Encounter: Payer: Self-pay | Admitting: Obstetrics and Gynecology

## 2013-12-13 ENCOUNTER — Ambulatory Visit (INDEPENDENT_AMBULATORY_CARE_PROVIDER_SITE_OTHER): Payer: BC Managed Care – PPO | Admitting: Obstetrics and Gynecology

## 2013-12-13 VITALS — BP 128/70 | HR 57 | Temp 97.6°F | Wt 166.4 lb

## 2013-12-13 DIAGNOSIS — Z3403 Encounter for supervision of normal first pregnancy, third trimester: Secondary | ICD-10-CM

## 2013-12-13 DIAGNOSIS — O352XX0 Maternal care for (suspected) hereditary disease in fetus, not applicable or unspecified: Secondary | ICD-10-CM

## 2013-12-13 DIAGNOSIS — O99840 Bariatric surgery status complicating pregnancy, unspecified trimester: Secondary | ICD-10-CM

## 2013-12-13 DIAGNOSIS — O352XX1 Maternal care for (suspected) hereditary disease in fetus, fetus 1: Secondary | ICD-10-CM

## 2013-12-13 DIAGNOSIS — O99843 Bariatric surgery status complicating pregnancy, third trimester: Secondary | ICD-10-CM

## 2013-12-13 DIAGNOSIS — Z3400 Encounter for supervision of normal first pregnancy, unspecified trimester: Secondary | ICD-10-CM

## 2013-12-13 DIAGNOSIS — O3090 Multiple gestation, unspecified, unspecified trimester: Secondary | ICD-10-CM

## 2013-12-13 LAB — POCT URINALYSIS DIP (DEVICE)
Bilirubin Urine: NEGATIVE
GLUCOSE, UA: NEGATIVE mg/dL
Ketones, ur: NEGATIVE mg/dL
NITRITE: NEGATIVE
Protein, ur: NEGATIVE mg/dL
Specific Gravity, Urine: 1.025 (ref 1.005–1.030)
UROBILINOGEN UA: 1 mg/dL (ref 0.0–1.0)
pH: 6 (ref 5.0–8.0)

## 2013-12-13 NOTE — Progress Notes (Signed)
Patient is doing well without complaints. FM/PTL precautions reviewed. Cultures next visit 

## 2013-12-13 NOTE — Progress Notes (Signed)
Patient reports pelvic pressure.

## 2013-12-18 ENCOUNTER — Encounter: Payer: Self-pay | Admitting: *Deleted

## 2013-12-18 NOTE — Progress Notes (Unsigned)
FMLA completed for Spouse, will give to her at next visit.  Pt needs to sign ROI.

## 2013-12-27 ENCOUNTER — Ambulatory Visit (INDEPENDENT_AMBULATORY_CARE_PROVIDER_SITE_OTHER): Payer: BC Managed Care – PPO | Admitting: Family

## 2013-12-27 ENCOUNTER — Other Ambulatory Visit: Payer: Self-pay | Admitting: Family

## 2013-12-27 VITALS — BP 130/87 | HR 63 | Temp 97.3°F | Wt 164.3 lb

## 2013-12-27 DIAGNOSIS — Z3400 Encounter for supervision of normal first pregnancy, unspecified trimester: Secondary | ICD-10-CM

## 2013-12-27 DIAGNOSIS — Z3403 Encounter for supervision of normal first pregnancy, third trimester: Secondary | ICD-10-CM

## 2013-12-27 LAB — POCT URINALYSIS DIP (DEVICE)
GLUCOSE, UA: NEGATIVE mg/dL
Hgb urine dipstick: NEGATIVE
KETONES UR: NEGATIVE mg/dL
NITRITE: NEGATIVE
Protein, ur: NEGATIVE mg/dL
Specific Gravity, Urine: 1.025 (ref 1.005–1.030)
Urobilinogen, UA: 1 mg/dL (ref 0.0–1.0)
pH: 5.5 (ref 5.0–8.0)

## 2013-12-27 NOTE — Addendum Note (Signed)
Addended by: Aldona Lento on: 12/27/2013 11:12 AM   Modules accepted: Orders

## 2013-12-27 NOTE — Addendum Note (Signed)
Addended by: Aldona Lento on: 12/27/2013 11:13 AM   Modules accepted: Orders

## 2013-12-27 NOTE — Progress Notes (Signed)
Ordered follow-up ultrasound to reassess anatomy.  No problems or concerns.  GBS and GC/CT collected today.

## 2013-12-27 NOTE — Progress Notes (Signed)
Follow-up U/S 01/02/14 @ 145p.

## 2013-12-28 LAB — GC/CHLAMYDIA PROBE AMP
CT Probe RNA: NEGATIVE
GC Probe RNA: NEGATIVE

## 2013-12-28 LAB — WET PREP, GENITAL
Clue Cells Wet Prep HPF POC: NONE SEEN
TRICH WET PREP: NONE SEEN
WBC, Wet Prep HPF POC: NONE SEEN
Yeast Wet Prep HPF POC: NONE SEEN

## 2013-12-30 LAB — CULTURE, BETA STREP (GROUP B ONLY)

## 2013-12-31 ENCOUNTER — Encounter (HOSPITAL_COMMUNITY): Payer: Self-pay | Admitting: *Deleted

## 2013-12-31 ENCOUNTER — Encounter: Payer: Self-pay | Admitting: Family

## 2013-12-31 ENCOUNTER — Inpatient Hospital Stay (HOSPITAL_COMMUNITY)
Admission: AD | Admit: 2013-12-31 | Discharge: 2013-12-31 | Disposition: A | Discharge status: Home / Self Care | Payer: BC Managed Care – PPO | Source: Ambulatory Visit | Attending: Obstetrics and Gynecology | Admitting: Obstetrics and Gynecology

## 2013-12-31 DIAGNOSIS — R109 Unspecified abdominal pain: Secondary | ICD-10-CM | POA: Diagnosis not present

## 2013-12-31 DIAGNOSIS — O99891 Other specified diseases and conditions complicating pregnancy: Secondary | ICD-10-CM | POA: Diagnosis not present

## 2013-12-31 DIAGNOSIS — O9989 Other specified diseases and conditions complicating pregnancy, childbirth and the puerperium: Principal | ICD-10-CM

## 2013-12-31 HISTORY — DX: Candidiasis of vulva and vagina: B37.3

## 2013-12-31 HISTORY — DX: Acute candidiasis of vulva and vagina: B37.31

## 2013-12-31 NOTE — Discharge Instructions (Signed)
Braxton Hicks Contractions °Contractions of the uterus can occur throughout pregnancy. Contractions are not always a sign that you are in labor.  °WHAT ARE BRAXTON HICKS CONTRACTIONS?  °Contractions that occur before labor are called Braxton Hicks contractions, or false labor. Toward the end of pregnancy (32-34 weeks), these contractions can develop more often and may become more forceful. This is not true labor because these contractions do not result in opening (dilatation) and thinning of the cervix. They are sometimes difficult to tell apart from true labor because these contractions can be forceful and people have different pain tolerances. You should not feel embarrassed if you go to the hospital with false labor. Sometimes, the only way to tell if you are in true labor is for your health care provider to look for changes in the cervix. °If there are no prenatal problems or other health problems associated with the pregnancy, it is completely safe to be sent home with false labor and await the onset of true labor. °HOW CAN YOU TELL THE DIFFERENCE BETWEEN TRUE AND FALSE LABOR? °False Labor °· The contractions of false labor are usually shorter and not as hard as those of true labor.   °· The contractions are usually irregular.   °· The contractions are often felt in the front of the lower abdomen and in the groin.   °· The contractions may go away when you walk around or change positions while lying down.   °· The contractions get weaker and are shorter lasting as time goes on.   °· The contractions do not usually become progressively stronger, regular, and closer together as with true labor.   °True Labor °· Contractions in true labor last 30-70 seconds, become very regular, usually become more intense, and increase in frequency.   °· The contractions do not go away with walking.   °· The discomfort is usually felt in the top of the uterus and spreads to the lower abdomen and low back.   °· True labor can be  determined by your health care provider with an exam. This will show that the cervix is dilating and getting thinner.   °WHAT TO REMEMBER °· Keep up with your usual exercises and follow other instructions given by your health care provider.   °· Take medicines as directed by your health care provider.   °· Keep your regular prenatal appointments.   °· Eat and drink lightly if you think you are going into labor.   °· If Braxton Hicks contractions are making you uncomfortable:   °¨ Change your position from lying down or resting to walking, or from walking to resting.   °¨ Sit and rest in a tub of warm water.   °¨ Drink 2-3 glasses of water. Dehydration may cause these contractions.   °¨ Do slow and deep breathing several times an hour.   °WHEN SHOULD I SEEK IMMEDIATE MEDICAL CARE? °Seek immediate medical care if: °· Your contractions become stronger, more regular, and closer together.   °· You have fluid leaking or gushing from your vagina.   °· You have a fever.   °· You pass blood-tinged mucus.   °· You have vaginal bleeding.   °· You have continuous abdominal pain.   °· You have low back pain that you never had before.   °· You feel your baby's head pushing down and causing pelvic pressure.   °· Your baby is not moving as much as it used to.   °Document Released: 04/19/2005 Document Revised: 04/24/2013 Document Reviewed: 01/29/2013 °ExitCare® Patient Information ©2015 [employer]. This information is not intended to replace advice given to you by your health care   provider. Make sure you discuss any questions you have with your health care provider. ° °

## 2013-12-31 NOTE — MAU Note (Signed)
PT SAYS SHE COMES HERE FOR PNC.    SAYS SAT  SHE WAS HAVE UC-  SHE WENT TO [facility]-   3 CM.   -MONITORED- SENT HOME.       THEN TODAY-   AT 8PM-  FEELS LOWER PRESSURE  AND CRAMPING.Marland Kitchen    LAST SEX-   Thursday.   DENIES HSV AND MRSA.  GBS-  UNSURE.

## 2014-01-02 ENCOUNTER — Ambulatory Visit (HOSPITAL_COMMUNITY)
Admission: RE | Admit: 2014-01-02 | Discharge: 2014-01-02 | Disposition: A | Discharge status: Home / Self Care | Payer: BC Managed Care – PPO | Source: Ambulatory Visit | Attending: Family | Admitting: Family

## 2014-01-02 DIAGNOSIS — Z3689 Encounter for other specified antenatal screening: Secondary | ICD-10-CM | POA: Insufficient documentation

## 2014-01-02 DIAGNOSIS — Z3403 Encounter for supervision of normal first pregnancy, third trimester: Secondary | ICD-10-CM

## 2014-01-02 DIAGNOSIS — O358XX0 Maternal care for other (suspected) fetal abnormality and damage, not applicable or unspecified: Secondary | ICD-10-CM | POA: Diagnosis not present

## 2014-01-03 ENCOUNTER — Inpatient Hospital Stay (HOSPITAL_COMMUNITY)
Admission: AD | Admit: 2014-01-03 | Discharge: 2014-01-03 | Disposition: A | Discharge status: Home / Self Care | Payer: BC Managed Care – PPO | Source: Ambulatory Visit | Attending: Obstetrics & Gynecology | Admitting: Obstetrics & Gynecology

## 2014-01-03 ENCOUNTER — Encounter (HOSPITAL_COMMUNITY): Payer: Self-pay | Admitting: *Deleted

## 2014-01-03 DIAGNOSIS — O9989 Other specified diseases and conditions complicating pregnancy, childbirth and the puerperium: Principal | ICD-10-CM

## 2014-01-03 DIAGNOSIS — O99891 Other specified diseases and conditions complicating pregnancy: Secondary | ICD-10-CM | POA: Insufficient documentation

## 2014-01-03 DIAGNOSIS — O479 False labor, unspecified: Secondary | ICD-10-CM | POA: Diagnosis not present

## 2014-01-03 DIAGNOSIS — N898 Other specified noninflammatory disorders of vagina: Secondary | ICD-10-CM | POA: Insufficient documentation

## 2014-01-03 LAB — POCT FERN TEST: POCT Fern Test: NEGATIVE

## 2014-01-03 NOTE — Discharge Instructions (Signed)
Third Trimester of Pregnancy The third trimester is from week 29 through week 42, months 7 through 9. This trimester is when your unborn baby (fetus) is growing very fast. At the end of the ninth month, the unborn baby is about 20 inches in length. It weighs about 6-10 pounds.  HOME CARE   Avoid all smoking, herbs, and alcohol. Avoid drugs not approved by your doctor.  Only take medicine as told by your doctor. Some medicines are safe and some are not during pregnancy.  Exercise only as told by your doctor. Stop exercising if you start having cramps.  Eat regular, healthy meals.  Wear a good support bra if your breasts are tender.  Do not use hot tubs, steam rooms, or saunas.  Wear your seat belt when driving.  Avoid raw meat, uncooked cheese, and liter boxes and soil used by cats.  Take your prenatal vitamins.  Try taking medicine that helps you poop (stool softener) as needed, and if your doctor approves. Eat more fiber by eating fresh fruit, vegetables, and whole grains. Drink enough fluids to keep your pee (urine) clear or pale yellow.  Take warm water baths (sitz baths) to soothe pain or discomfort caused by hemorrhoids. Use hemorrhoid cream if your doctor approves.  If you have puffy, bulging veins (varicose veins), wear support hose. Raise (elevate) your feet for 15 minutes, 3-4 times a day. Limit salt in your diet.  Avoid heavy lifting, wear low heels, and sit up straight.  Rest with your legs raised if you have leg cramps or low back pain.  Visit your dentist if you have not gone during your pregnancy. Use a soft toothbrush to brush your teeth. Be gentle when you floss.  You can have sex (intercourse) unless your doctor tells you not to.  Do not travel far distances unless you must. Only do so with your doctor's approval.  Take prenatal classes.  Practice driving to the hospital.  Pack your hospital bag.  Prepare the baby's room.  Go to your doctor visits. GET  HELP IF:  You are not sure if you are in labor or if your water has broken.  You are dizzy.  You have mild cramps or pressure in your lower belly (abdominal).  You have a nagging pain in your belly area.  You continue to feel sick to your stomach (nauseous), throw up (vomit), or have watery poop (diarrhea).  You have bad smelling fluid coming from your vagina.  You have pain with peeing (urination). GET HELP RIGHT AWAY IF:   You have a fever.  You are leaking fluid from your vagina.  You are spotting or bleeding from your vagina.  You have severe belly cramping or pain.  You lose or gain weight rapidly.  You have trouble catching your breath and have chest pain.  You notice sudden or extreme puffiness (swelling) of your face, hands, ankles, feet, or legs.  You have not felt the baby move in over an hour.  You have severe headaches that do not go away with medicine.  You have vision changes. Document Released: 07/14/2009 Document Revised: 08/14/2012 Document Reviewed: 06/20/2012 ExitCare Patient Information 2015 [employer]. This information is not intended to replace advice given to you by your health care provider. Make sure you discuss any questions you have with your health care provider.  

## 2014-01-03 NOTE — MAU Note (Signed)
PT SAYS SHE WAS ON SOFA- FELT LEAK  THEN MORE FLUID-  BUT IN RM  2  SAYS NO FLUID .     GETS PNC- DOWNSTAIRS-   VE   3 CM   THIS  WEEK.    DENIES HSV AND MRSA.    GBS- UNSURE

## 2014-01-03 NOTE — MAU Note (Signed)
Pt reports leaking fluid since 2030

## 2014-01-03 NOTE — MAU Provider Note (Signed)
None : First encounter with provider at 2255 on 01/03/14   Chief Complaint:  Rupture of Membranes   [name] is  28 y.o. G2P0010 at [redacted]w[redacted]d presents complaining of Rupture of Membranes Approximately 1 hour ago, she was sitting on the couch and noted a clear gush of fluid, enough to soak her underwear and leave a tiny puddle on the couch. In the last few minutes she noted a contraction: she has constant pain in her lower back with radiation to the abdomen- it has only happened once and seems to be lasting a few minutes. No others noted during my interview and exam.  Denies vaginal bleeding and abnormal vaginal discharge. Good fetal movement   Obstetrical/Gynecological History: OB History   Grav Para Term Preterm Abortions TAB SAB Ect Mult Living   2 0 0 0 1 1 0        Past Medical History: Past Medical History  Diagnosis Date  . Medical history non-contributory   . Yeast vaginitis     Past Surgical History: Past Surgical History  Procedure Laterality Date  . Bariatric surgery    . Bariatric surgery N/A 2013    sleave    Family History: Family History  Problem Relation Age of Onset  . Hypertension Mother   . Hyperlipidemia Mother   . Hypertension Father   . Hyperlipidemia Father     Social History: History  Substance Use Topics  . Smoking status: Never Smoker   . Smokeless tobacco: Never Used  . Alcohol Use: Yes     Comment: occasionally prior to preg    Allergies:  Allergies  Allergen Reactions  . Fruit Extracts Anaphylaxis    MANGO  Reports anaphylaxis   . Penicillins Anaphylaxis    Meds:  Prescriptions prior to admission  Medication Sig Dispense Refill  . calcium carbonate (TUMS - DOSED IN MG ELEMENTAL CALCIUM) 500 MG chewable tablet Chew 1 tablet by mouth daily.      Marland Kitchen CALCIUM PO Take 1 tablet by mouth daily.      . Prenatal Vit-Fe Fumarate-FA (MULTIVITAMIN-PRENATAL) 27-0.8 MG TABS tablet Take 1 tablet by mouth daily at 12 noon.         Review of Systems -   Review of Systems  Constitutional: Negative for fever, chills, weight loss, malaise/fatigue and diaphoresis.  HENT: Negative for hearing loss, ear pain, nosebleeds, congestion, sore throat, neck pain, tinnitus and ear discharge.   Eyes: Negative for blurred vision, double vision, photophobia, pain, discharge and redness.  Respiratory: Negative for cough, hemoptysis, sputum production, shortness of breath, wheezing and stridor.   Cardiovascular: Negative for chest pain, palpitations, orthopnea,  leg swelling  Gastrointestinal: Negative for heartburn, nausea, vomiting, diarrhea, constipation, blood in stool Genitourinary: Negative for dysuria, urgency, frequency, hematuria and flank pain.  Musculoskeletal: Negative for myalgias, back pain, joint pain and falls.  Skin: Negative for itching and rash.  Neurological: Negative for dizziness, tingling, tremors, sensory change, speech change, focal weakness, seizures, loss of consciousness, weakness and headaches.  Endo/Heme/Allergies: Negative for environmental allergies and polydipsia. Does not bruise/bleed easily.  Psychiatric/Behavioral: Negative for depression, suicidal ideas, hallucinations, memory loss and substance abuse. The patient is not nervous/anxious and does not have insomnia.      Physical Exam  Blood pressure 114/76, pulse 60, temperature 97.7 F (36.5 C), temperature source Oral, resp. rate 16, height 5' (1.524 m), weight 74.844 kg (165 lb), last menstrual period 04/17/2013, SpO2 98.00%. GENERAL: Well-developed, well-nourished female in no acute distress.  LUNGS: Clear to auscultation bilaterally.  HEART: Regular rate and rhythm. ABDOMEN: Soft, nontender, nondistended, gravid.  EXTREMITIES: Nontender, no edema, 2+ distal pulses. DTR's 2+ SPECULUM EXAM: Scant amount of white discharge in the vaginal vault. No pooling of fluid in the vaginal vault. No blood noted in the vaginal vault or cervical os.   CERVICAL EXAM: 3/thick/hi FHT:  Baseline rate 140 bpm   Variability moderate  Accelerations present   Decelerations none Contractions: Irregular   Labs: Fern negative  Imaging Studies:  US Ob Follow Up  01/02/2014   OBSTETRICAL ULTRASOUND: This exam was performed within a [facility]. The OB US report was generated in the AS system, and faxed to the ordering physician.   This report is available in the [employer]. See the [facility] Obstetric US report via the Image Link.   Assessment: [name] is  28 y.o. G2P0010 at [redacted]w[redacted]d presents with vaginal discharge found to be fern negative on exam without fluid pooling in the vaginal vault. Irregular contractions on exam without cervical change from previous exams per the patient.  Plan: - Discharge home  - Discussed labor precautions/kick counts   Joanna Puff 9/3/201511:32 PM  I have seen and examined this patient and agree the above assessment. CRESENZO-DISHMAN,Bartley Vuolo 01/04/2014 7:15 AM

## 2014-01-14 ENCOUNTER — Ambulatory Visit (INDEPENDENT_AMBULATORY_CARE_PROVIDER_SITE_OTHER): Payer: BC Managed Care – PPO | Admitting: Obstetrics & Gynecology

## 2014-01-14 VITALS — BP 128/86 | HR 67 | Wt 167.0 lb

## 2014-01-14 DIAGNOSIS — O99840 Bariatric surgery status complicating pregnancy, unspecified trimester: Secondary | ICD-10-CM

## 2014-01-14 DIAGNOSIS — Z3403 Encounter for supervision of normal first pregnancy, third trimester: Secondary | ICD-10-CM

## 2014-01-14 DIAGNOSIS — Z3400 Encounter for supervision of normal first pregnancy, unspecified trimester: Secondary | ICD-10-CM

## 2014-01-14 DIAGNOSIS — O99843 Bariatric surgery status complicating pregnancy, third trimester: Secondary | ICD-10-CM

## 2014-01-14 LAB — POCT URINALYSIS DIP (DEVICE)
BILIRUBIN URINE: NEGATIVE
GLUCOSE, UA: NEGATIVE mg/dL
Hgb urine dipstick: NEGATIVE
KETONES UR: NEGATIVE mg/dL
Nitrite: NEGATIVE
Protein, ur: NEGATIVE mg/dL
Specific Gravity, Urine: 1.015 (ref 1.005–1.030)
Urobilinogen, UA: 1 mg/dL (ref 0.0–1.0)
pH: 7 (ref 5.0–8.0)

## 2014-01-14 NOTE — Progress Notes (Signed)
Was seen in MAU for labor check on 01/03/14; cervix was 3/thick/high.  Recheck today unchanged. Will start postdates testing after 01/23/14.  No other complaints or concerns.  Labor and fetal movement precautions reviewed.

## 2014-01-14 NOTE — Progress Notes (Signed)
Patient refused flu shot.

## 2014-01-14 NOTE — Patient Instructions (Signed)
Return to clinic for any obstetric concerns or go to MAU for evaluation  

## 2014-01-24 ENCOUNTER — Other Ambulatory Visit: Payer: BC Managed Care – PPO

## 2014-03-04 ENCOUNTER — Encounter: Payer: Self-pay | Admitting: Nurse Practitioner

## 2014-03-04 ENCOUNTER — Ambulatory Visit (INDEPENDENT_AMBULATORY_CARE_PROVIDER_SITE_OTHER): Payer: BC Managed Care – PPO | Admitting: Nurse Practitioner

## 2014-03-04 VITALS — BP 110/69 | HR 65 | Temp 98.2°F | Ht 60.0 in | Wt 148.9 lb

## 2014-03-04 DIAGNOSIS — Z01812 Encounter for preprocedural laboratory examination: Secondary | ICD-10-CM | POA: Diagnosis not present

## 2014-03-04 DIAGNOSIS — F53 Postpartum depression: Secondary | ICD-10-CM

## 2014-03-04 DIAGNOSIS — Z3041 Encounter for surveillance of contraceptive pills: Secondary | ICD-10-CM

## 2014-03-04 DIAGNOSIS — O99345 Other mental disorders complicating the puerperium: Secondary | ICD-10-CM

## 2014-03-04 DIAGNOSIS — Z309 Encounter for contraceptive management, unspecified: Secondary | ICD-10-CM | POA: Insufficient documentation

## 2014-03-04 LAB — POCT PREGNANCY, URINE: Preg Test, Ur: NEGATIVE

## 2014-03-04 MED ORDER — NORGESTIMATE-ETH ESTRADIOL 0.25-35 MG-MCG PO TABS
1.0000 | ORAL_TABLET | Freq: Every day | ORAL | Status: DC
Start: 2014-03-04 — End: 2014-03-16

## 2014-03-04 NOTE — Progress Notes (Signed)
Here for PP today. Would like to discuss birth control options-- thinks she may want OCP. Reports having protected sex with condoms every time since delivery. 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 UPT obtained and if negative will proceed with birth control today.

## 2014-03-04 NOTE — Progress Notes (Signed)
Patient ID: [name], female   DOB: Jan 21, 1986, 28 y.o.   MRN: [id number] Subjective:     Kristy MoMichelle Kristine Sherrod is a 28 y.o. female who presents for a postpartum visit. She is 6 week postpartum following a spontaneous vaginal delivery. I have fully reviewed the prenatal and intrapartum course. The delivery was at 39 gestational weeks. Outcome: spontaneous vaginal delivery. Anesthesia: epidural. Postpartum course has been depression started on Effexor. States she is feeling better now.  Baby's course has been uneventful. Baby is feeding by bottle. Bleeding no bleeding. Bowel function is normal. Bladder function is normal. Patient is sexually active. Contraception method is OCP (estrogen/progesterone). She denies any risk factors for pill use including HTN, migraine with aura, clotting disorder.  Postpartum depression screening: positive.  The following portions of the patient's history were reviewed and updated as appropriate: current medications, past family history, past medical history, past social history, past surgical history and problem list.  Review of Systems Pertinent items are noted in HPI.   Objective:    BP 110/69 mmHg  Pulse 65  Temp(Src) 98.2 F (36.8 C) (Oral)  Ht 5' (1.524 m)  Wt 148 lb 14.4 oz (67.541 kg)  BMI 29.08 kg/m2  LMP 04/17/2013  General:  alert   Breasts:  inspection negative, no nipple discharge or bleeding, no masses or nodularity palpable  Lungs: clear to auscultation bilaterally  Heart:  regular rate and rhythm, S1, S2 normal, no murmur, click, rub or gallop  Abdomen: soft, non-tender; bowel sounds normal; no masses,  no organomegaly     Vagina: not evaluated  Cervix:  not examined  Corpus: not examined  Adnexa:  not evaluated  Rectal Exam: Not performed.        Assessment:     postpartum exam. Pap smear not done at today's visit.   Plan:    1. Contraception: OCP (estrogen/progesterone)/ OrthoCyclen BCP ok to start today. Use  condoms for one pack 2. Advised to seek counseling if PP depression does not resolve or worsens 3. Follow up in: 1 year or as needed.

## 2014-03-04 NOTE — Patient Instructions (Signed)
Contraception Choices Contraception (birth control) is the use of any methods or devices to prevent pregnancy. Below are some methods to help avoid pregnancy. HORMONAL METHODS   Contraceptive implant. This is a thin, plastic tube containing progesterone hormone. It does not contain estrogen hormone. Your health care provider inserts the tube in the inner part of the upper arm. The tube can remain in place for up to 3 years. After 3 years, the implant must be removed. The implant prevents the ovaries from releasing an egg (ovulation), thickens the cervical mucus to prevent sperm from entering the uterus, and thins the lining of the inside of the uterus.  Progesterone-only injections. These injections are given every 3 months by your health care provider to prevent pregnancy. This synthetic progesterone hormone stops the ovaries from releasing eggs. It also thickens cervical mucus and changes the uterine lining. This makes it harder for sperm to survive in the uterus.  Birth control pills. These pills contain estrogen and progesterone hormone. They work by preventing the ovaries from releasing eggs (ovulation). They also cause the cervical mucus to thicken, preventing the sperm from entering the uterus. Birth control pills are prescribed by a health care provider.Birth control pills can also be used to treat heavy periods.  Minipill. This type of birth control pill contains only the progesterone hormone. They are taken every day of each month and must be prescribed by your health care provider.  Birth control patch. The patch contains hormones similar to those in birth control pills. It must be changed once a week and is prescribed by a health care provider.  Vaginal ring. The ring contains hormones similar to those in birth control pills. It is left in the vagina for 3 weeks, removed for 1 week, and then a new one is put back in place. The patient must be comfortable inserting and removing the ring  from the vagina.A health care provider's prescription is necessary.  Emergency contraception. Emergency contraceptives prevent pregnancy after unprotected sexual intercourse. This pill can be taken right after sex or up to 5 days after unprotected sex. It is most effective the sooner you take the pills after having sexual intercourse. Most emergency contraceptive pills are available without a prescription. Check with your pharmacist. Do not use emergency contraception as your only form of birth control. BARRIER METHODS   Female condom. This is a thin sheath (latex or rubber) that is worn over the penis during sexual intercourse. It can be used with spermicide to increase effectiveness.  Female condom. This is a soft, loose-fitting sheath that is put into the vagina before sexual intercourse.  Diaphragm. This is a soft, latex, dome-shaped barrier that must be fitted by a health care provider. It is inserted into the vagina, along with a spermicidal jelly. It is inserted before intercourse. The diaphragm should be left in the vagina for 6 to 8 hours after intercourse.  Cervical cap. This is a round, soft, latex or plastic cup that fits over the cervix and must be fitted by a health care provider. The cap can be left in place for up to 48 hours after intercourse.  Sponge. This is a soft, circular piece of polyurethane foam. The sponge has spermicide in it. It is inserted into the vagina after wetting it and before sexual intercourse.  Spermicides. These are chemicals that kill or block sperm from entering the cervix and uterus. They come in the form of creams, jellies, suppositories, foam, or tablets. They do not require a   prescription. They are inserted into the vagina with an applicator before having sexual intercourse. The process must be repeated every time you have sexual intercourse. INTRAUTERINE CONTRACEPTION  Intrauterine device (IUD). This is a T-shaped device that is put in a woman's uterus  during a menstrual period to prevent pregnancy. There are 2 types:  Copper IUD. This type of IUD is wrapped in copper wire and is placed inside the uterus. Copper makes the uterus and fallopian tubes produce a fluid that kills sperm. It can stay in place for 10 years.  Hormone IUD. This type of IUD contains the hormone progestin (synthetic progesterone). The hormone thickens the cervical mucus and prevents sperm from entering the uterus, and it also thins the uterine lining to prevent implantation of a fertilized egg. The hormone can weaken or kill the sperm that get into the uterus. It can stay in place for 3-5 years, depending on which type of IUD is used. PERMANENT METHODS OF CONTRACEPTION  Female tubal ligation. This is when the woman's fallopian tubes are surgically sealed, tied, or blocked to prevent the egg from traveling to the uterus.  Hysteroscopic sterilization. This involves placing a small coil or insert into each fallopian tube. Your doctor uses a technique called hysteroscopy to do the procedure. The device causes scar tissue to form. This results in permanent blockage of the fallopian tubes, so the sperm cannot fertilize the egg. It takes about 3 months after the procedure for the tubes to become blocked. You must use another form of birth control for these 3 months.  Female sterilization. This is when the female has the tubes that carry sperm tied off (vasectomy).This blocks sperm from entering the vagina during sexual intercourse. After the procedure, the man can still ejaculate fluid (semen). NATURAL PLANNING METHODS  Natural family planning. This is not having sexual intercourse or using a barrier method (condom, diaphragm, cervical cap) on days the woman could become pregnant.  Calendar method. This is keeping track of the length of each menstrual cycle and identifying when you are fertile.  Ovulation method. This is avoiding sexual intercourse during ovulation.  Symptothermal  method. This is avoiding sexual intercourse during ovulation, using a thermometer and ovulation symptoms.  Post-ovulation method. This is timing sexual intercourse after you have ovulated. Regardless of which type or method of contraception you choose, it is important that you use condoms to protect against the transmission of sexually transmitted infections (STIs). Talk with your health care provider about which form of contraception is most appropriate for you. Document Released: 04/19/2005 Document Revised: 04/24/2013 Document Reviewed: 10/12/2012 [facility] Patient Information 2015 [employer] [place]. This information is not intended to replace advice given to you by your health care provider. Make sure you discuss any questions you have with your health care provider. Postpartum Care After Vaginal Delivery After you deliver your newborn (postpartum period), the usual stay in the hospital is 24-72 hours. If there were problems with your labor or delivery, or if you have other medical problems, you might be in the hospital longer.  While you are in the hospital, you will receive help and instructions on how to care for yourself and your newborn during the postpartum period.  While you are in the hospital:  Be sure to tell your nurses if you have pain or discomfort, as well as where you feel the pain and what makes the pain worse.  If you had an incision made near your vagina (episiotomy) or if you had some tearing  during delivery, the nurses may put ice packs on your episiotomy or tear. The ice packs may help to reduce the pain and swelling.  If you are breastfeeding, you may feel uncomfortable contractions of your uterus for a couple of weeks. This is normal. The contractions help your uterus get back to normal size.  It is normal to have some bleeding after delivery.  For the first 1-3 days after delivery, the flow is red and the amount may be similar to a period.  It is common for the flow to  start and stop.  In the first few days, you may pass some small clots. Let your nurses know if you begin to pass large clots or your flow increases.  Do not  flush blood clots down the toilet before having the nurse look at them.  During the next 3-10 days after delivery, your flow should become more watery and pink or brown-tinged in color.  Ten to fourteen days after delivery, your flow should be a small amount of yellowish-white discharge.  The amount of your flow will decrease over the first few weeks after delivery. Your flow may stop in 6-8 weeks. Most women have had their flow stop by 12 weeks after delivery.  You should change your sanitary pads frequently.  Wash your hands thoroughly with soap and water for at least 20 seconds after changing pads, using the toilet, or before holding or feeding your newborn.  You should feel like you need to empty your bladder within the first 6-8 hours after delivery.  In case you become weak, lightheaded, or faint, call your nurse before you get out of bed for the first time and before you take a shower for the first time.  Within the first few days after delivery, your breasts may begin to feel tender and full. This is called engorgement. Breast tenderness usually goes away within 48-72 hours after engorgement occurs. You may also notice milk leaking from your breasts. If you are not breastfeeding, do not stimulate your breasts. Breast stimulation can make your breasts produce more milk.  Spending as much time as possible with your newborn is very important. During this time, you and your newborn can feel close and get to know each other. Having your newborn stay in your room (rooming in) will help to strengthen the bond with your newborn. It will give you time to get to know your newborn and become comfortable caring for your newborn.  Your hormones change after delivery. Sometimes the hormone changes can temporarily cause you to feel sad or  tearful. These feelings should not last more than a few days. If these feelings last longer than that, you should talk to your caregiver.  If desired, talk to your caregiver about methods of family planning or contraception.  Talk to your caregiver about immunizations. Your caregiver may want you to have the following immunizations before leaving the hospital:  Tetanus, diphtheria, and pertussis (Tdap) or tetanus and diphtheria (Td) immunization. It is very important that you and your family (including grandparents) or others caring for your newborn are up-to-date with the Tdap or Td immunizations. The Tdap or Td immunization can help protect your newborn from getting ill.  Rubella immunization.  Varicella (chickenpox) immunization.  Influenza immunization. You should receive this annual immunization if you did not receive the immunization during your pregnancy. Document Released: 02/14/2007 Document Revised: 01/12/2012 Document Reviewed: 12/15/2011 ExitCare Patient Information 2015 [employer]. This information is not intended to replace advice   given to you by your health care provider. Make sure you discuss any questions you have with your health care provider.  

## 2014-03-10 ENCOUNTER — Encounter (HOSPITAL_COMMUNITY): Payer: Self-pay | Admitting: *Deleted

## 2014-03-10 ENCOUNTER — Emergency Department (HOSPITAL_COMMUNITY)
Admission: EM | Admit: 2014-03-10 | Discharge: 2014-03-12 | Disposition: A | Discharge status: Home / Self Care | Payer: BC Managed Care – PPO | Attending: Emergency Medicine | Admitting: Emergency Medicine

## 2014-03-10 DIAGNOSIS — Z880 Allergy status to penicillin: Secondary | ICD-10-CM | POA: Diagnosis not present

## 2014-03-10 DIAGNOSIS — R630 Anorexia: Secondary | ICD-10-CM | POA: Insufficient documentation

## 2014-03-10 DIAGNOSIS — F53 Postpartum depression: Secondary | ICD-10-CM

## 2014-03-10 DIAGNOSIS — F419 Anxiety disorder, unspecified: Secondary | ICD-10-CM | POA: Insufficient documentation

## 2014-03-10 DIAGNOSIS — Z793 Long term (current) use of hormonal contraceptives: Secondary | ICD-10-CM | POA: Diagnosis not present

## 2014-03-10 DIAGNOSIS — O99345 Other mental disorders complicating the puerperium: Secondary | ICD-10-CM

## 2014-03-10 DIAGNOSIS — Z8619 Personal history of other infectious and parasitic diseases: Secondary | ICD-10-CM | POA: Diagnosis not present

## 2014-03-10 DIAGNOSIS — Z79899 Other long term (current) drug therapy: Secondary | ICD-10-CM | POA: Diagnosis not present

## 2014-03-10 DIAGNOSIS — F329 Major depressive disorder, single episode, unspecified: Secondary | ICD-10-CM | POA: Diagnosis present

## 2014-03-10 LAB — COMPREHENSIVE METABOLIC PANEL
ALK PHOS: 92 U/L (ref 39–117)
ALT: 21 U/L (ref 0–35)
AST: 25 U/L (ref 0–37)
Albumin: 3.6 g/dL (ref 3.5–5.2)
Anion gap: 13 (ref 5–15)
BILIRUBIN TOTAL: 0.6 mg/dL (ref 0.3–1.2)
BUN: 10 mg/dL (ref 6–23)
CHLORIDE: 103 meq/L (ref 96–112)
CO2: 26 meq/L (ref 19–32)
Calcium: 8.9 mg/dL (ref 8.4–10.5)
Creatinine, Ser: 0.74 mg/dL (ref 0.50–1.10)
GFR calc Af Amer: >90 mL/min (ref >90)
GFR calc non Af Amer: >90 mL/min (ref >90)
Glucose, Bld: 91 mg/dL (ref 70–99)
Potassium: 3.7 mEq/L (ref 3.7–5.3)
SODIUM: 142 meq/L (ref 137–147)
Total Protein: 7 g/dL (ref 6.0–8.3)

## 2014-03-10 LAB — URINALYSIS, ROUTINE W REFLEX MICROSCOPIC
Glucose, UA: NEGATIVE mg/dL
Hgb urine dipstick: NEGATIVE
Ketones, ur: 15 mg/dL — AB
Nitrite: NEGATIVE
PH: 6 (ref 5.0–8.0)
PROTEIN: NEGATIVE mg/dL
Specific Gravity, Urine: 1.034 — ABNORMAL HIGH (ref 1.005–1.030)
Urobilinogen, UA: 1 mg/dL (ref 0.0–1.0)

## 2014-03-10 LAB — PROTEIN / CREATININE RATIO, URINE
Creatinine, Urine: 334.48 mg/dL
PROTEIN CREATININE RATIO: 0.06 (ref 0.00–0.15)
Total Protein, Urine: 20.2 mg/dL

## 2014-03-10 LAB — CBC
HCT: 37.1 % (ref 36.0–46.0)
Hemoglobin: 11.3 g/dL — ABNORMAL LOW (ref 12.0–15.0)
MCH: 23.6 pg — ABNORMAL LOW (ref 26.0–34.0)
MCHC: 30.5 g/dL (ref 30.0–36.0)
MCV: 77.5 fL — ABNORMAL LOW (ref 78.0–100.0)
PLATELETS: 278 10*3/uL (ref 150–400)
RBC: 4.79 MIL/uL (ref 3.87–5.11)
RDW: 17.2 % — ABNORMAL HIGH (ref 11.5–15.5)
WBC: 7.3 10*3/uL (ref 4.0–10.5)

## 2014-03-10 LAB — RAPID URINE DRUG SCREEN, HOSP PERFORMED
AMPHETAMINES: NOT DETECTED
BARBITURATES: NOT DETECTED
BENZODIAZEPINES: NOT DETECTED
COCAINE: NOT DETECTED
Opiates: NOT DETECTED
TETRAHYDROCANNABINOL: NOT DETECTED

## 2014-03-10 LAB — URINE MICROSCOPIC-ADD ON

## 2014-03-10 LAB — SALICYLATE LEVEL: Salicylate Lvl: <2 mg/dL — ABNORMAL LOW (ref 2.8–20.0)

## 2014-03-10 LAB — ACETAMINOPHEN LEVEL: Acetaminophen (Tylenol), Serum: <15 ug/mL (ref 10–30)

## 2014-03-10 LAB — ETHANOL

## 2014-03-10 LAB — URIC ACID: URIC ACID, SERUM: 5.2 mg/dL (ref 2.4–7.0)

## 2014-03-10 MED ORDER — VENLAFAXINE HCL 25 MG PO TABS
25.0000 mg | ORAL_TABLET | Freq: Once | ORAL | Status: DC
  Filled 2014-03-10: qty 1

## 2014-03-10 MED ORDER — VENLAFAXINE HCL ER 37.5 MG PO CP24
37.5000 mg | ORAL_CAPSULE | Freq: Every day | ORAL | Status: DC
  Administered 2014-03-11 – 2014-03-12 (×2): 37.5 mg via ORAL
  Filled 2014-03-10 (×2): qty 1

## 2014-03-10 MED ORDER — PRENATAL 27-0.8 MG PO TABS
1.0000 | ORAL_TABLET | Freq: Every day | ORAL | Status: DC
  Administered 2014-03-11 – 2014-03-12 (×2): 1 via ORAL
  Filled 2014-03-10 (×2): qty 1

## 2014-03-10 MED ORDER — ONDANSETRON HCL 4 MG PO TABS: 4.0000 mg | ORAL_TABLET | Freq: Three times a day (TID) | ORAL | Status: DC | PRN

## 2014-03-10 MED ORDER — ACETAMINOPHEN 325 MG PO TABS: 650.0000 mg | ORAL_TABLET | ORAL | Status: DC | PRN

## 2014-03-10 NOTE — ED Notes (Signed)
Offered client snack and a drink.

## 2014-03-10 NOTE — ED Notes (Signed)
Pt wanded by security and placed in paper scrubs. 
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
## 2014-03-10 NOTE — ED Notes (Signed)
TTS at bedside. 

## 2014-03-10 NOTE — ED Provider Notes (Signed)
CSN: [id number]     Arrival date & time 03/10/14  1534 History   First MD Initiated Contact with Patient 03/10/14 1549     Chief Complaint  Patient presents with  . Depression  . Medical Clearance     (Consider location/radiation/quality/duration/timing/severity/associated sxs/prior Treatment) HPI Comments: Patient presents with a complaint of postpartum depression. She states she has been feeling depressed since the birth of her baby 7 weeks ago. She has been pushing the baby in its creatinine but has no thoughts of potentially harming the baby. His family is concerned to leave her alone with the baby. She has been on Effexor for the past 3 weeks with no change. She admits to depression before she was pregnant but does not take medication. She denies any current suicidal or homicidal ideation. No hallucinations. No chest pain, shortness of breath, nausea, vomiting or fever. No vaginal bleeding or discharge. No problems with blood pressure or blood sugar during pregnancy. Drinks 2 alcoholic beverages a day.  The history is provided by the patient.    Past Medical History  Diagnosis Date  . Medical history non-contributory   . Yeast vaginitis    Past Surgical History  Procedure Laterality Date  . Bariatric surgery    . Bariatric surgery N/A 2013    sleave   Family History  Problem Relation Age of Onset  . Hypertension Mother   . Hyperlipidemia Mother   . Hypertension Father   . Hyperlipidemia Father    History  Substance Use Topics  . Smoking status: Never Smoker   . Smokeless tobacco: Never Used  . Alcohol Use: Yes     Comment: occasionally prior to preg   OB History    Gravida Para Term Preterm AB TAB SAB Ectopic Multiple Living   2 0 0 0 1 1 0        Review of Systems  Constitutional: Positive for activity change and appetite change. Negative for fever.  Eyes: Negative for visual disturbance.  Respiratory: Negative for cough, chest tightness and shortness of breath.    Cardiovascular: Negative for chest pain.  Gastrointestinal: Negative for nausea, vomiting and abdominal pain.  Genitourinary: Negative for dysuria and hematuria.  Musculoskeletal: Negative for myalgias, back pain and arthralgias.  Skin: Negative for rash.  Neurological: Negative for dizziness, weakness and headaches.  Psychiatric/Behavioral: Positive for suicidal ideas, behavioral problems, self-injury and dysphoric mood. Negative for sleep disturbance. The patient is nervous/anxious.   A complete 10 system review of systems was obtained and all systems are negative except as noted in the HPI and PMH.      Allergies  Fruit extracts and Penicillins  Home Medications   Prior to Admission medications   Medication Sig Start Date End Date Taking? Authorizing Provider  calcium carbonate (TUMS - DOSED IN MG ELEMENTAL CALCIUM) 500 MG chewable tablet Chew 1 tablet by mouth daily.   Yes Historical Provider, MD  CALCIUM PO Take 1 tablet by mouth daily.   Yes Historical Provider, MD  norgestimate-ethinyl estradiol (ORTHO-CYCLEN,SPRINTEC,PREVIFEM) 0.25-35 MG-MCG tablet Take 1 tablet by mouth daily. 03/04/14  Yes Delbert PhenixLinda M Barefoot, NP  Prenatal Vit-Fe Fumarate-FA (MULTIVITAMIN-PRENATAL) 27-0.8 MG TABS tablet Take 1 tablet by mouth daily at 12 noon.   Yes Historical Provider, MD  venlafaxine (EFFEXOR) 37.5 MG tablet Take 37.5 mg by mouth daily.   Yes Historical Provider, MD  venlafaxine XR (EFFEXOR-XR) 37.5 MG 24 hr capsule Take 37.5 mg by mouth daily with breakfast.   Yes Historical Provider, MD  BP 113/77 mmHg  Pulse 66  Temp(Src) 98.1 F (36.7 C) (Oral)  Resp 16  SpO2 100%  LMP 04/17/2013 Physical Exam  Constitutional: She is oriented to person, place, and time. She appears well-developed and well-nourished. No distress.  HENT:  Head: Normocephalic and atraumatic.  Mouth/Throat: Oropharynx is clear and moist. No oropharyngeal exudate.  Eyes: Conjunctivae and EOM are normal. Pupils are  equal, round, and reactive to light.  Neck: Normal range of motion. Neck supple.  No meningismus.  Cardiovascular: Normal rate, regular rhythm, normal heart sounds and intact distal pulses.   No murmur heard. Pulmonary/Chest: Effort normal and breath sounds normal. No respiratory distress.  Abdominal: Soft. There is no tenderness. There is no rebound and no guarding.  Musculoskeletal: Normal range of motion. She exhibits no edema or tenderness.  Neurological: She is alert and oriented to person, place, and time. No cranial nerve deficit. She exhibits normal muscle tone. Coordination normal.  No ataxia on finger to nose bilaterally. No pronator drift. 5/5 strength throughout. CN 2-12 intact. Negative Romberg. Equal grip strength. Sensation intact. Gait is normal.   Skin: Skin is warm.  Psychiatric: She has a normal mood and affect. Her behavior is normal.  Nursing note and vitals reviewed.   ED Course  Procedures (including critical care time) Labs Review Labs Reviewed  CBC - Abnormal; Notable for the following:    Hemoglobin 11.3 (*)    MCV 77.5 (*)    MCH 23.6 (*)    RDW 17.2 (*)    All other components within normal limits  SALICYLATE LEVEL - Abnormal; Notable for the following:    Salicylate Lvl <2.0 (*)    All other components within normal limits  URINALYSIS, ROUTINE W REFLEX MICROSCOPIC - Abnormal; Notable for the following:    APPearance CLOUDY (*)    Specific Gravity, Urine 1.034 (*)    Bilirubin Urine SMALL (*)    Ketones, ur 15 (*)    Leukocytes, UA TRACE (*)    All other components within normal limits  URINE MICROSCOPIC-ADD ON - Abnormal; Notable for the following:    Squamous Epithelial / LPF FEW (*)    All other components within normal limits  ACETAMINOPHEN LEVEL  COMPREHENSIVE METABOLIC PANEL  ETHANOL  URINE RAPID DRUG SCREEN (HOSP PERFORMED)  PROTEIN / CREATININE RATIO, URINE  URIC ACID    Imaging Review No results found.   EKG Interpretation None       MDM   Final diagnoses:  Post partum depression   Depression in setting of recent delivery. Initial blood pressure 138/89. We'll recheck. No chest pain or shortness of breath.  Blood pressure improved to 110/76.  LFTs normal. Creatinine normal. No proteinuria. Urine protein/creatinine ratio normal.  Case discussed with Dr. Shawnie PonsPratt of OB. She agrees patient is out of window for risk of preeclampsia. Her blood pressure has normalized. Her labs are within normal limits.  As patient has been threatening to the baby and family is worried about the baby's safety, we'll have psychiatry evaluate. Holding orders placed.   Discussed with [facility]. Mid-level's recommendation was to increase patient's Effexor and have family members care for the baby. This seems inappropriate given her concerns that she may hurt the baby. We'll have psychiatry evaluate in the morning.  Glynn OctaveStephen Honest Safranek, MD 03/11/14 [id number]

## 2014-03-10 NOTE — ED Notes (Addendum)
Pt reports having postpartum depression, has 277 week old baby. Family reports that pt is threat to the baby, they will not leave her alone with baby, pt has been on effexor for 3 weeks with no relief of symptoms.

## 2014-03-10 NOTE — ED Notes (Addendum)
TTS completed. Mother left with all of pt belongings.  [facility] and he stated pt would be staying overnight and having a face to face consult in AM with Psychiatrist.

## 2014-03-10 NOTE — BH Assessment (Signed)
Assessment complete. Gave clinical report to Janann Augustori Burkett, NP who recommends 1) Pt's Effexor be increased from 37.5 mg daily to 75 mg daily, 2) family is to continue to care for the baby until Pt is evaluated by outpatient provider and 3) Pt should follow up with current provider, Dr. Zonia KiefStephens, or other outpatient provider. Discussed this recommendation with Dr. Glynn OctaveStephen Rancour who wants Pt to be evaluated by a psychiatrist. Informed Dr. Manus Gunningancour [facility] and Pt will be scheduled to be evaluated by psychiatry tomorrow. Notified Tonia GhentJanie Barnwell, RN of recommendation.  Harlin RainFord Ellis Ria CommentWarrick Jr, LPC, [facility] Triage Specialist [phone]

## 2014-03-10 NOTE — BH Assessment (Signed)
Notified of assessment request in shift report. Spoke to Glynn OctaveStephen Rancour, MD who said Pt is experiencing post partum depression and is having episode of trying to harm her seven-week-old baby. Family is concerned to leave baby in her care. Tele-assessment will be initiated.  Harlin RainFord Ellis Ria CommentWarrick Jr, LPC, [facility] Triage Specialist [phone]

## 2014-03-11 DIAGNOSIS — F329 Major depressive disorder, single episode, unspecified: Secondary | ICD-10-CM

## 2014-03-11 DIAGNOSIS — R45851 Suicidal ideations: Secondary | ICD-10-CM

## 2014-03-11 NOTE — Consult Note (Signed)
[name] Face-to-Face Psychiatry Consult   Reason for Consult:  Post partum depression and aggressiopn towards infant Referring Physician:  EDP [name] is an 28 y.o. female. Total Time spent with patient: 45 minutes  Assessment: AXIS I:  Major Depression, single episode AXIS II:  Deferred AXIS III:   Past Medical History  Diagnosis Date  . Medical history non-contributory   . Yeast vaginitis    AXIS IV:  other psychosocial or environmental problems, problems related to social environment and problems with primary support group AXIS V:  41-50 serious symptoms  Plan:  Recommend psychiatric Inpatient admission when medically cleared. Supportive therapy provided about ongoing stressors.  Subjective:   [name] is a 28 y.o. female patient admitted with Post partum depression and HI towards infant.  HPI:  [name] is an 28 y.o. female, married, Caucasian seen for psychiatric consultation and evaluation for postpartum depression and suicidal ideation. Patient has been suffering with symptoms of depression, anxiety, frustration, irritability and getting upset and also reportedly taking anger upon 7 weeks intact by yelling, inching and pushing baby. Patient mother and father lives across the street who is concerned about her safety and safety of the baby. Patient reported her husband has been in contact with his ex girlfriend which making her more depressed and stressful. Patient also reported taking care of the young in fact and not able to work making her stressful too. Patient reported disturbance of sleep, dispense of appetite, irritability, agitation and suicidal ideation. Patient has no previous history of mental health problems. 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   Patient mother has been supportive to her, [name] [phone]. Patient was seen her primary care physician, Dr. Minette Brine at [facility]. Dr Minette Brine prescribed Effexor 37.5 mg daily. Pt reports she has been compliant with medication and that she has noticed some improvement but not as much as she expected. Patient describes her symptoms of depression including anhedonia, social withdrawal, decreased appetite, decreased sleep, increased anxiety and occasional feelings of hopelessness. She worries that she will do something to harm the baby but has no intention or plan to do so. She denies any history of intentional self-injurious behavior. She denies any homicidal ideation, current thoughts of wanting to harm the baby, or history of violence. She denies alcohol or substance abuse. Patient lives with her husband and their seven-week-old daughter. 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 She denies any history of inpatient or outpatient mental health or substance abuse treatment other than the recent prescription for Effexor.  HPI Elements: Location:  depression. Quality:  disturbed sleep and appetite and increased anger. Severity:  suicidal ideation. Timing:  postpartum stress.  Past Psychiatric History: Past Medical History  Diagnosis Date  . Medical history non-contributory   . Yeast vaginitis     reports that she has never smoked. She has never used smokeless tobacco. She reports that she drinks alcohol. She reports that she does not use illicit drugs. Family History  Problem Relation Age of Onset  . Hypertension Mother   . Hyperlipidemia Mother   . Hypertension Father   . Hyperlipidemia Father    Family History Substance Abuse: No Family Supports: Yes, List: (Parents and other family members are supportive) Living Arrangements: Spouse/significant other, Other (Comment) (37 week old daughter) Can pt return to  current living arrangement?: Yes Abuse/Neglect [facility]) Physical  Abuse: Denies Verbal Abuse: Denies Sexual Abuse: Denies Allergies:   Allergies  Allergen Reactions  . Fruit Extracts Anaphylaxis    MANGO  Reports anaphylaxis   . Penicillins Anaphylaxis    ACT Assessment Complete:  Yes:    Educational Status    Risk to Self: Risk to self with the past 6 months Suicidal Ideation: No Suicidal Intent: No Is patient at risk for suicide?: No Suicidal Plan?: No Access to Means: No What has been your use of drugs/alcohol within the last 12 months?: Pt denies Previous Attempts/Gestures: No How many times?: 0 Other Self Harm Risks: None identified Triggers for Past Attempts: None known Intentional Self Injurious Behavior: None Family Suicide History: Yes (Maternal aunt committed suicide) Recent stressful life event(s): Conflict (Comment), Other (Comment) (New baby, Conflicts with husband) Persecutory voices/beliefs?: No Depression: Yes Depression Symptoms: Despondent, Isolating, Fatigue, Guilt, Loss of interest in usual pleasures, Feeling angry/irritable Substance abuse history and/or treatment for substance abuse?: No Suicide prevention information given to non-admitted patients: Not applicable  Risk to Others: Risk to Others within the past 6 months Homicidal Ideation: No Thoughts of Harm to Others: No Current Homicidal Intent: No Current Homicidal Plan: No Access to Homicidal Means: No Identified Victim: None History of harm to others?: No Assessment of Violence: On admission Violent Behavior Description: Pushed baby down Does patient have access to weapons?: No Criminal Charges Pending?: No Does patient have a court date: No  Abuse: Abuse/Neglect Assessment (Assessment to be complete while patient is alone) Physical Abuse: Denies Verbal Abuse: Denies Sexual Abuse: Denies Exploitation of patient/patient's resources: Denies Self-Neglect: Denies  Prior Inpatient Therapy: Prior Inpatient Therapy Prior Inpatient Therapy: No Prior Therapy  Dates: NA Prior Therapy Facilty/Provider(s): NA Reason for Treatment: NA  Prior Outpatient Therapy: Prior Outpatient Therapy Prior Outpatient Therapy: No Prior Therapy Dates: NA Prior Therapy Facilty/Provider(s): NA Reason for Treatment: NA  Additional Information: Additional Information 1:1 In Past 12 Months?: No CIRT Risk: No Elopement Risk: No Does patient have medical clearance?: Yes    Objective: Blood pressure 117/65, pulse 79, temperature 97.9 F (36.6 C), temperature source Oral, resp. rate 18, last menstrual period 04/17/2013, SpO2 100 %.There is no weight on file to calculate BMI. Results for orders placed or performed during the hospital encounter of 03/10/14 (from the past 72 hour(s))  Acetaminophen level     Status: None   Collection Time: 03/10/14  4:04 PM  Result Value Ref Range   Acetaminophen (Tylenol), Serum <15.0 10 - 30 ug/mL    Comment:        THERAPEUTIC CONCENTRATIONS VARY SIGNIFICANTLY. A RANGE OF 10-30 ug/mL MAY BE AN EFFECTIVE CONCENTRATION FOR MANY PATIENTS. HOWEVER, SOME ARE BEST TREATED AT CONCENTRATIONS OUTSIDE THIS RANGE. ACETAMINOPHEN CONCENTRATIONS >150 ug/mL AT 4 HOURS AFTER INGESTION AND >50 ug/mL AT 12 HOURS AFTER INGESTION ARE OFTEN ASSOCIATED WITH TOXIC REACTIONS.   CBC     Status: Abnormal   Collection Time: 03/10/14  4:04 PM  Result Value Ref Range   WBC 7.3 4.0 - 10.5 K/uL   RBC 4.79 3.87 - 5.11 MIL/uL   Hemoglobin 11.3 (L) 12.0 - 15.0 g/dL   HCT 37.1 36.0 - 46.0 %   MCV 77.5 (L) 78.0 - 100.0 fL   MCH 23.6 (L) 26.0 - 34.0 pg   MCHC 30.5 30.0 - 36.0 g/dL   RDW 17.2 (H) 11.5 - 15.5 %   Platelets 278 150 - 400 K/uL  Comprehensive metabolic panel  Status: None   Collection Time: 03/10/14  4:04 PM  Result Value Ref Range   Sodium 142 137 - 147 mEq/L   Potassium 3.7 3.7 - 5.3 mEq/L   Chloride 103 96 - 112 mEq/L   CO2 26 19 - 32 mEq/L   Glucose, Bld 91 70 - 99 mg/dL   BUN 10 6 - 23 mg/dL   Creatinine, Ser 0.74 0.50 - 1.10  mg/dL   Calcium 8.9 8.4 - 10.5 mg/dL   Total Protein 7.0 6.0 - 8.3 g/dL   Albumin 3.6 3.5 - 5.2 g/dL   AST 25 0 - 37 U/L   ALT 21 0 - 35 U/L   Alkaline Phosphatase 92 39 - 117 U/L   Total Bilirubin 0.6 0.3 - 1.2 mg/dL   GFR calc non Af Amer >90 >90 mL/min   GFR calc Af Amer >90 >90 mL/min    Comment: (NOTE) The eGFR has been calculated using the CKD EPI equation. This calculation has not been validated in all clinical situations. eGFR's persistently <90 mL/min signify possible Chronic Kidney Disease.    Anion gap 13 5 - 15  Ethanol (ETOH)     Status: None   Collection Time: 03/10/14  4:04 PM  Result Value Ref Range   Alcohol, Ethyl (B) <11 0 - 11 mg/dL    Comment:        LOWEST DETECTABLE LIMIT FOR SERUM ALCOHOL IS 11 mg/dL FOR MEDICAL PURPOSES ONLY   Salicylate level     Status: Abnormal   Collection Time: 03/10/14  4:04 PM  Result Value Ref Range   Salicylate Lvl <9.8 (L) 2.8 - 20.0 mg/dL  Uric acid     Status: None   Collection Time: 03/10/14  4:04 PM  Result Value Ref Range   Uric Acid, Serum 5.2 2.4 - 7.0 mg/dL  Urine Drug Screen     Status: None   Collection Time: 03/10/14  4:13 PM  Result Value Ref Range   Opiates NONE DETECTED NONE DETECTED   Cocaine NONE DETECTED NONE DETECTED   Benzodiazepines NONE DETECTED NONE DETECTED   Amphetamines NONE DETECTED NONE DETECTED   Tetrahydrocannabinol NONE DETECTED NONE DETECTED   Barbiturates NONE DETECTED NONE DETECTED    Comment:        DRUG SCREEN FOR MEDICAL PURPOSES ONLY.  IF CONFIRMATION IS NEEDED FOR ANY PURPOSE, NOTIFY LAB WITHIN 5 DAYS.        LOWEST DETECTABLE LIMITS FOR URINE DRUG SCREEN Drug Class       Cutoff (ng/mL) Amphetamine      1000 Barbiturate      200 Benzodiazepine   921 Tricyclics       194 Opiates          300 Cocaine          300 THC              50   Urinalysis, Routine w reflex microscopic     Status: Abnormal   Collection Time: 03/10/14  4:13 PM  Result Value Ref Range   Color,  Urine YELLOW YELLOW   APPearance CLOUDY (A) CLEAR   Specific Gravity, Urine 1.034 (H) 1.005 - 1.030   pH 6.0 5.0 - 8.0   Glucose, UA NEGATIVE NEGATIVE mg/dL   Hgb urine dipstick NEGATIVE NEGATIVE   Bilirubin Urine SMALL (A) NEGATIVE   Ketones, ur 15 (A) NEGATIVE mg/dL   Protein, ur NEGATIVE NEGATIVE mg/dL   Urobilinogen, UA 1.0 0.0 - 1.0 mg/dL  Nitrite NEGATIVE NEGATIVE   Leukocytes, UA TRACE (A) NEGATIVE  Protein / creatinine ratio, urine     Status: None   Collection Time: 03/10/14  4:13 PM  Result Value Ref Range   Creatinine, Urine 334.48 mg/dL   Total Protein, Urine 20.2 mg/dL    Comment: NO NORMAL RANGE ESTABLISHED FOR THIS TEST   Protein Creatinine Ratio 0.06 0.00 - 0.15  Urine microscopic-add on     Status: Abnormal   Collection Time: 03/10/14  4:13 PM  Result Value Ref Range   Squamous Epithelial / LPF FEW (A) RARE   WBC, UA 3-6 <3 WBC/hpf   Urine-Other MUCOUS PRESENT    Labs are reviewed .  Current Facility-Administered Medications  Medication Dose Route Frequency Provider Last Rate Last Dose  . acetaminophen (TYLENOL) tablet 650 mg  650 mg Oral Q4H PRN Ezequiel Essex, MD      . multivitamin-prenatal tablet 1 tablet  1 tablet Oral Q1200 Ezequiel Essex, MD      . ondansetron [facility]) tablet 4 mg  4 mg Oral Q8H PRN Ezequiel Essex, MD      . venlafaxine XR (EFFEXOR-XR) 24 hr capsule 37.5 mg  37.5 mg Oral Q breakfast Ezequiel Essex, MD   37.5 mg at 03/11/14 3382   Current Outpatient Prescriptions  Medication Sig Dispense Refill  . calcium carbonate (TUMS - DOSED IN MG ELEMENTAL CALCIUM) 500 MG chewable tablet Chew 1 tablet by mouth daily.    Marland Kitchen CALCIUM PO Take 1 tablet by mouth daily.    . norgestimate-ethinyl estradiol (ORTHO-CYCLEN,SPRINTEC,PREVIFEM) 0.25-35 MG-MCG tablet Take 1 tablet by mouth daily. 1 Package 11  . Prenatal Vit-Fe Fumarate-FA (MULTIVITAMIN-PRENATAL) 27-0.8 MG TABS tablet Take 1 tablet by mouth daily at 12 noon.    . venlafaxine (EFFEXOR) 37.5 MG  tablet Take 37.5 mg by mouth daily.    Marland Kitchen venlafaxine XR (EFFEXOR-XR) 37.5 MG 24 hr capsule Take 37.5 mg by mouth daily with breakfast.      Psychiatric Specialty Exam: Physical Exam Full physical performed in Emergency Department. I have reviewed this assessment and concur with its findings.   Review of Systems  Neurological: Positive for weakness.  Psychiatric/Behavioral: Positive for depression and suicidal ideas. The patient is nervous/anxious and has insomnia.     Blood pressure 117/65, pulse 79, temperature 97.9 F (36.6 C), temperature source Oral, resp. rate 18, last menstrual period 04/17/2013, SpO2 100 %.There is no weight on file to calculate BMI.  General Appearance: Guarded  Eye Contact::  Good  Speech:  Clear and Coherent  Volume:  Decreased  Mood:  Anxious, Depressed and Irritable  Affect:  Congruent and Depressed  Thought Process:  Coherent and Goal Directed  Orientation:  Full (Time, Place, and Person)  Thought Content:  WDL  Suicidal Thoughts:  Yes.  with intent/plan  Homicidal Thoughts:  No  Memory:  Immediate;   Fair Recent;   Fair  Judgement:  Impaired  Insight:  Fair  Psychomotor Activity:  Decreased  Concentration:  Good  Recall:  Good  Fund of Knowledge:Good  Language: Good  Akathisia:  NA  Handed:  Right  AIMS (if indicated):     Assets:  Communication Skills Desire for Improvement Financial Resources/Insurance Housing Intimacy Leisure Time [place] Talents/Skills Transportation Vocational/Educational  Sleep:      Musculoskeletal: Strength & Muscle Tone: within normal limits Gait & Station: normal Patient leans: N/A  Treatment Plan Summary: Daily contact with patient to assess and evaluate symptoms and progress in  treatment Medication management  Recommended acute psychiatric hospitalization for crisis stabilization, safety monitoring her medication management of depression with suicidal ideation and  anger outbursts which can cause damage to infant child.  [name],JANARDHAHA R. 03/11/2014 10:28 AM

## 2014-03-11 NOTE — BH Assessment (Signed)
Writer consulted with Dr. Jonnalagadda and he will be re-assessing the patient.  Writer informed the RN, (Ed White).  

## 2014-03-11 NOTE — ED Notes (Addendum)
Pt and mother voiced that if no bed available, then pt feel ok to go home. Pt and family informed that pt needs inpatient treatment per psychiatrist recommendation. Pt and mother informed that this RN will speak with EDP about their concerns. Spoke with Dr. Deretha EmoryZackowski, he states he agrees with psychiatrist assessment and pt needs inpatient treatment. Pt and family informed. Pt verbalized understanding and states she is ok to wait.

## 2014-03-11 NOTE — ED Notes (Signed)
Family at bedside. 

## 2014-03-11 NOTE — ED Notes (Signed)
[facility] called back states no psychiatrist available tonight, pt will be evaluated tomorrow and decide disposition [facility]. Pt made aware.

## 2014-03-12 ENCOUNTER — Encounter (HOSPITAL_COMMUNITY): Payer: Self-pay | Admitting: *Deleted

## 2014-03-12 ENCOUNTER — Inpatient Hospital Stay (HOSPITAL_COMMUNITY)
Admission: AD | Admit: 2014-03-12 | Discharge: 2014-03-16 | DRG: 884 | Disposition: A | Discharge status: Home / Self Care | Payer: BC Managed Care – PPO | Source: Intra-hospital | Attending: Psychiatry | Admitting: Psychiatry

## 2014-03-12 DIAGNOSIS — F53 Postpartum depression: Secondary | ICD-10-CM | POA: Diagnosis present

## 2014-03-12 DIAGNOSIS — F321 Major depressive disorder, single episode, moderate: Secondary | ICD-10-CM | POA: Diagnosis present

## 2014-03-12 DIAGNOSIS — Z3400 Encounter for supervision of normal first pregnancy, unspecified trimester: Secondary | ICD-10-CM

## 2014-03-12 DIAGNOSIS — G4700 Insomnia, unspecified: Secondary | ICD-10-CM | POA: Diagnosis present

## 2014-03-12 DIAGNOSIS — Z8249 Family history of ischemic heart disease and other diseases of the circulatory system: Secondary | ICD-10-CM

## 2014-03-12 DIAGNOSIS — O99840 Bariatric surgery status complicating pregnancy, unspecified trimester: Secondary | ICD-10-CM

## 2014-03-12 DIAGNOSIS — K219 Gastro-esophageal reflux disease without esophagitis: Secondary | ICD-10-CM | POA: Diagnosis present

## 2014-03-12 DIAGNOSIS — F419 Anxiety disorder, unspecified: Secondary | ICD-10-CM | POA: Diagnosis present

## 2014-03-12 DIAGNOSIS — E569 Vitamin deficiency, unspecified: Secondary | ICD-10-CM | POA: Diagnosis present

## 2014-03-12 DIAGNOSIS — Z3041 Encounter for surveillance of contraceptive pills: Secondary | ICD-10-CM

## 2014-03-12 DIAGNOSIS — O99345 Other mental disorders complicating the puerperium: Secondary | ICD-10-CM | POA: Diagnosis present

## 2014-03-12 MED ORDER — TRAZODONE HCL 50 MG PO TABS
50.0000 mg | ORAL_TABLET | Freq: Every evening | ORAL | Status: DC | PRN
  Administered 2014-03-12 – 2014-03-16 (×5): 50 mg via ORAL
  Filled 2014-03-12 (×14): qty 1

## 2014-03-12 MED ORDER — NORGESTIMATE-ETH ESTRADIOL 0.25-35 MG-MCG PO TABS: 1.0000 | ORAL_TABLET | Freq: Every day | ORAL | Status: DC

## 2014-03-12 MED ORDER — ACETAMINOPHEN 325 MG PO TABS: 650.0000 mg | ORAL_TABLET | Freq: Four times a day (QID) | ORAL | Status: DC | PRN

## 2014-03-12 MED ORDER — ALUM & MAG HYDROXIDE-SIMETH 200-200-20 MG/5ML PO SUSP: 30.0000 mL | ORAL | Status: DC | PRN

## 2014-03-12 MED ORDER — PRENATAL 27-0.8 MG PO TABS: 1.0000 | ORAL_TABLET | Freq: Every day | ORAL | Status: DC

## 2014-03-12 MED ORDER — VENLAFAXINE HCL ER 37.5 MG PO CP24
37.5000 mg | ORAL_CAPSULE | Freq: Every day | ORAL | Status: DC
  Administered 2014-03-13: 37.5 mg via ORAL
  Filled 2014-03-12 (×2): qty 1

## 2014-03-12 MED ORDER — CALCIUM CARBONATE 1250 (500 CA) MG PO TABS
1250.0000 mg | ORAL_TABLET | Freq: Every day | ORAL | Status: DC
  Administered 2014-03-13 – 2014-03-16 (×4): 1250 mg via ORAL
  Filled 2014-03-12 (×6): qty 1
  Filled 2014-03-12: qty 3

## 2014-03-12 MED ORDER — MAGNESIUM HYDROXIDE 400 MG/5ML PO SUSP: 30.0000 mL | Freq: Every day | ORAL | Status: DC | PRN

## 2014-03-12 NOTE — ED Notes (Signed)
Pt on the phone with family 

## 2014-03-12 NOTE — ED Notes (Signed)
Pt making a phone call. ?

## 2014-03-12 NOTE — ED Notes (Signed)
Pt taking a shower 

## 2014-03-12 NOTE — Tx Team (Signed)
Initial Interdisciplinary Treatment Plan   PATIENT STRESSORS: Traumatic event   PATIENT STRENGTHS: Average or above average intelligence Capable of independent living General fund of knowledge Supportive family/friends   PROBLEM LIST: Problem List/Patient Goals Date to be addressed Date deferred Reason deferred Estimated date of resolution  Depression  03/12/2014     anxiety 03/12/2014                                                DISCHARGE CRITERIA:  Improved stabilization in mood, thinking, and/or behavior Need for constant or close observation no longer present  PRELIMINARY DISCHARGE PLAN: Return to previous living arrangement Return to previous work or school arrangements  PATIENT/FAMIILY INVOLVEMENT: This treatment plan has been presented to and reviewed with the patient, [name].  The patient and family have been given the opportunity to ask questions and make suggestions.  Leighton Parodyyson, Alma Mohiuddin M 03/12/2014, 6:40 PM

## 2014-03-12 NOTE — Progress Notes (Signed)
Called to get admission orders on this patient at 1822 hrs from Dr. Lucianne MussKumar; there was no answer but message left for her to call back

## 2014-03-12 NOTE — ED Notes (Signed)
Pt resting quietly at the time. Vital signs stable. No signs of distress noted. 

## 2014-03-12 NOTE — ED Notes (Signed)
Updated pt on plan of care. Pt understood.

## 2014-03-12 NOTE — Progress Notes (Signed)
Pt has been accepted, 507-2, accepting Lucianne MussKumar. Pod C RN aware.  Derrell Lollingoris Joliene Salvador, MSW  Social Worker [phone]

## 2014-03-12 NOTE — ED Provider Notes (Signed)
Patient asleep when rounded on. No noted complaints, is waiting on reevaluation. Vital signs stable.  Audree CamelScott T Sheryn Aldaz, MD 03/12/14 [id number]

## 2014-03-12 NOTE — Progress Notes (Signed)
SW placed follow up calls to:  [facility]  Derrell Lollingoris Catalaya Garr, MSW  Social Worker [phone]

## 2014-03-12 NOTE — Progress Notes (Signed)
The focus of this group is to help patients review their daily goal of treatment and discuss progress on daily workbooks. Pt attended the evening group session but responded minimally to all discussion prompts from the Writer. Pt shared that today was an okay day and said that she worked a job in Print production plannermental health, so being a Pt in a facility such as this was peculiar for her. Pt told the group that upon discharge she hoped to move back home to [place] and pursue a degree in Nursing. Pt reported having no additional needs from Nursing Staff this evening. Her affect was appropriate.

## 2014-03-12 NOTE — BH Assessment (Signed)
Inpt recommended. No [facility] beds at this time. Sending referrals for [facility] to the following: [facility]  Tylek Boney, [place] Triage Specialist 03/12/2014 6:48 AM

## 2014-03-12 NOTE — ED Provider Notes (Signed)
Patient accepted to behavioral health, accepting physician is Dr. Kumar  Kensie Susman T Curtistine Pettitt, MD 03/12/14 1427 

## 2014-03-13 ENCOUNTER — Encounter (HOSPITAL_COMMUNITY): Payer: Self-pay | Admitting: Psychiatry

## 2014-03-13 DIAGNOSIS — O99345 Other mental disorders complicating the puerperium: Secondary | ICD-10-CM

## 2014-03-13 DIAGNOSIS — F329 Major depressive disorder, single episode, unspecified: Secondary | ICD-10-CM

## 2014-03-13 MED ORDER — VENLAFAXINE HCL ER 75 MG PO CP24
75.0000 mg | ORAL_CAPSULE | Freq: Every day | ORAL | Status: DC
  Administered 2014-03-14 – 2014-03-16 (×3): 75 mg via ORAL
  Filled 2014-03-13 (×5): qty 1

## 2014-03-13 MED ORDER — VENLAFAXINE HCL ER 37.5 MG PO CP24
37.5000 mg | ORAL_CAPSULE | Freq: Once | ORAL | Status: AC
  Administered 2014-03-13: 37.5 mg via ORAL
  Filled 2014-03-13 (×2): qty 1

## 2014-03-13 MED ORDER — BUSPIRONE HCL 15 MG PO TABS
7.5000 mg | ORAL_TABLET | Freq: Three times a day (TID) | ORAL | Status: DC
  Administered 2014-03-13 – 2014-03-16 (×9): 7.5 mg via ORAL
  Filled 2014-03-13 (×16): qty 1

## 2014-03-13 NOTE — Plan of Care (Signed)
Problem: Alteration in mood; excessive anxiety as evidenced by: Goal: STG-Pt will report an absence of self-harm thoughts/actions (Patient will report an absence of self-harm thoughts or actions)  Outcome: Progressing Pt denies suicidal ideation intent or plan this shift

## 2014-03-13 NOTE — Progress Notes (Signed)
D: Patient denies SI/HI and A/V hallucinations; patient reports sleep is good; reports appetite is fair; reports energy level is low ; reports ability to concentrate is good; rates depression as 4/10; rates hopelessness 0/10; rates anxiety as 4/10;   A: Monitored q 15 minutes; patient encouraged to attend groups; patient educated about medications; patient given medications per physician orders; patient encouraged to express feelings and/or concerns  R: Patient is appropriate and cooperative; patient is minimal in her interaction;  patient's interaction with staff and peers is appropriate; patient was able to set goal to talk with staff 1:1 when having feelings of SI; patient is taking medications as prescribed and tolerating medications; patient is attending all groups

## 2014-03-13 NOTE — BHH Suicide Risk Assessment (Signed)
   Nursing information obtained from:  Patient Demographic factors:  Adolescent or young adult, Caucasian Current Mental Status:  NA Loss Factors:  NA Historical Factors:  Family history of mental illness or substance abuse Risk Reduction Factors:  Responsible for children under 27 years of age, Sense of responsibility to family Total Time spent with patient: 30 minutes  CLINICAL FACTORS:   Depression:   Insomnia  Psychiatric Specialty Exam: Physical Exam  ROS  Blood pressure 128/95, pulse 71, temperature 97.6 F (36.4 C), temperature source Oral, resp. rate 20, height 5' 0.5" (1.537 m), weight 67.586 kg (149 lb), last menstrual period 04/17/2013.Body mass index is 28.61 kg/(m^2).  Please see H&P for MSE.      SUICIDE RISK:   Minimal: No identifiable suicidal ideation.  PLAN OF CARE:Please see H&P.   I certify that inpatient services furnished can reasonably be expected to improve the patient's condition.  Bonney Berres MD 03/13/2014, 2:56 PM

## 2014-03-13 NOTE — [facility] Group Notes (Signed)
[facility]   03/13/2014 11:15 AM  Participation Quality:  Appropriate   Mood/Affect:  Appropriate  Depression Rating:  4  Anxiety Rating:  4  Thoughts of Suicide:  No Will you contract for safety?   NA  Current AVH:  No  Plan for Discharge/Comments:  Pt reports [facility] to "anger problems and some depression and anxiety." pt stated that she has 247 week old baby and noticed depressive/anxiety symptoms after 2 weeks of giving birth. She attributes this to "not being able to work and having to be home all day with the baby." Pt requesting referral for med management/therapy and would like information about anger management. CSW assessing. Pt plans to return home at d/c with her husband and baby.   Transportation Means: husband or a parent   Supports: parents and husband. Pt reports many family supports but stated that she does not have many friends. Pt shared that she spoke with her family last night who agreed to help her more with the new baby so that she can get a break.   Smart, American FinancialHeather LCSWA

## 2014-03-13 NOTE — BHH Group Notes (Signed)
B[facility] LCSW Group Therapy  03/13/2014 1:50 PM  Type of Therapy:  Group Therapy  Participation Level:  Active  Participation Quality:  Appropriate and Attentive  Affect:  Depressed  Cognitive:  Alert and Appropriate  Insight:  Engaged  Engagement in Therapy:  Engaged  Modes of Intervention:  Discussion, Education, Socialization and Support  Summary of Progress/Problems: Mental Health Association (MHA) speaker came to talk about his personal journey with substance abuse and mental illness. Group members were challenged to process ways by which to relate to the speaker. MHA speaker provided handouts and educational information pertaining to groups and services offered by the [facility]. [name] attended group and stayed the entire time. She asked questions about the groups offered at [facility].     Hyatt,Candace 03/13/2014, 1:50 PM

## 2014-03-13 NOTE — BHH Suicide Risk Assessment (Deleted)
B[facility] INPATIENT:  Family/Significant Other Suicide Prevention Education  Suicide Prevention Education:  Education Completed;Shawnee KnappJospeh Gurkin, [phone] has been identified by the patient as the family member/significant other with whom the patient will be residing, and identified as the person(s) who will aid the patient in the event of a mental health crisis (suicidal ideations/suicide attempt).  With written consent from the patient, the family member/significant other has been provided the following suicide prevention education, prior to the and/or following the discharge of the patient.  The suicide prevention education provided includes the following:  Suicide risk factors  Suicide prevention and interventions  National Suicide Hotline telephone number  [facility] assessment telephone number  [facility] Emergency Assistance 911  [facility] and/or Residential Mobile Crisis Unit telephone number  Request made of family/significant other to:  Remove weapons (e.g., guns, rifles, knives), all items previously/currently identified as safety concern.    Remove drugs/medications (over-the-counter, prescriptions, illicit drugs), all items previously/currently identified as a safety concern.  The family member/significant other verbalizes understanding of the suicide prevention education information provided.  The family member/significant other agrees to remove the items of safety concern listed above.  Escobar,Kristy 03/13/2014, 3:30 PM

## 2014-03-13 NOTE — BHH Counselor (Signed)
Adult Comprehensive Assessment  Patient ID: [name], female   DOB: 02-06-1986, 28 y.o.   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 she is currenlty on maternity leave which she identifies as a stressor "I need to go back to work and be around adults."  Family Relationships: pt reports a lack of support from husband and parents but stated that she discussed this with them yesterday and they are willing to help her more with her newborn baby. Financial / Lack of resources (include bankruptcy): none identified Housing / Lack of housing: lives with 667 week old daughter and husband of one year Physical health (include injuries & life threatening diseases): none identified  Social relationships: no friends reported, but pt reports a big loving family. Substance abuse: none identified Bereavement / Loss: none identified   Living/Environment/Situation:  Living Arrangements: Spouse/significant other, Children Living conditions (as described by patient or guardian): Lives in home with husband of one year and 667 week old daughter How long has patient lived in current situation?: one year What is atmosphere in current home: Comfortable, ParamedicLoving, Supportive  Family History:  Marital status: Married Number of Years Married: 1 What types of issues is patient dealing with in the relationship?: married for one year Additional relationship information: Pt reports strained relationship with husband/some resentment toward him after birth of baby because "he went back to work and I have to stay at home all day taking care of the baby." Pt reports that she needs more support from him. she reports anger directed at husband and is seeking anger management.  Does patient have children?: Yes How many children?: 1 How is  patient's relationship with their children?: 7 week older daughter. 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   Childhood History:  By whom was/is the patient raised?: Both parents Additional childhood history information: close relationship with parents as a child. "Normal childhood." her parents are still married. Description of patient's relationship with caregiver when they were a child: close to both parents Patient's description of current relationship with people who raised him/her: close to both parents  Does patient have siblings?: Yes Number of Siblings: 2 Description of patient's current relationship with siblings: "I have two older brothers that live in [place]. We are not as close as I'd like but it's hard to see them since they live so far away."  Did patient suffer any verbal/emotional/physical/sexual abuse as a child?: No Did patient suffer from severe childhood neglect?: No Has patient ever been sexually abused/assaulted/raped as an adolescent or adult?: No Was the patient ever a victim of a crime or a disaster?: No Witnessed domestic violence?: No Has patient been effected by domestic violence as an adult?: No  Education:  Highest grade of school patient has completed: some college  Currently a Consulting civil engineerstudent?: No Name of school: n/a  Learning disability?: No  Employment/Work Situation:   Employment situation: Leave of absence (maternity leave) 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: No  Alcohol/Substance Abuse:   What has been your use of drugs/alcohol within the last 12 months?: none identified  If attempted suicide, did drugs/alcohol play a role in this?: No Alcohol/Substance Abuse Treatment Hx: Denies past history If yes, describe treatment: n/a  Has alcohol/substance abuse ever caused legal problems?: No  Social Support System:   Forensic psychologistatient's Community Support System: Fair Museum/gallery exhibitions officerDescribe Community Support System: pt reports no close friends. "all my supports are family and my husband."  Type of faith/religion: n/a How does patient's faith help to cope with current illness?: n/a   Leisure/Recreation:   Leisure and Hobbies: shopping and traveling  Strengths/Needs:    motivation to seek treatment for depression and anger. Willingness to return to work. Needs: managing anger and depression. Mental health follow-up. Support from family in helping her to care for newborn.   Discharge Plan:   Does patient have access to transportation?: Yes (car and license) Will patient be returning to same living situation after discharge?: Yes Currently receiving community mental health services: No (pt had been [facility] her [facility]CP [facility] for [facility] health meds (Effexor 37.5mg  daily) ) If no, would patient like referral for services when discharged?: Yes (What county?) (med managment with psychiatrist and individual therapy. pt also requested info for anger management and this was provided to her by CSW.) Does patient have financial barriers related to discharge medications?: No (private insurance)  Summary/Recommendations:    Pt is 28 year old female [place] with [place] husband and 457 week old daughter. [facility] due to depression (post-partum), mood instability, and for medication  stabilization. Pt denies SI/HI/AVH and denies substance abuse. Pt reports that she has been experiencing increased depression/anxiety/anger/mood instability 2 week after the birth of her daughter. She reports that she has been on maternity leave from her job and thinks that returning to work and having more support from her husband and parents will benefit her mental health. Recommendations for pt include: therapeutic milieu, encourage group attendance and participation, medication management for mood stabilization, and development of comprehensive mental wellness plan. She currently sees her PCP [facility]Stephens [facility] for mental health meds (Effexor) but is open to referral for psychiatrist and individual therapist. Pt also requesting info/resources for Anger Management which have been provided to her by CSW.   Smart, ClearwaterHeather LCSWA 03/13/2014

## 2014-03-13 NOTE — Progress Notes (Signed)
Adult Psychoeducational Group Note  Date:  03/13/2014 Time:  10:14 PM  Group Topic/Focus:  Wrap-Up Group:   The focus of this group is to help patients review their daily goal of treatment and discuss progress on daily workbooks.  Participation Level:  Active  Participation Quality:  Appropriate  Affect:  Appropriate  Cognitive:  Appropriate  Insight: Appropriate  Engagement in Group:  Improving  Modes of Intervention:  Discussion  Additional Comments:  The patient expressed that she was inspired to  go to school from speaker in group.  Octavio Mannshigpen, Leeanne Butters Lee 03/13/2014, 10:14 PM
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
## 2014-03-13 NOTE — Progress Notes (Signed)
Patient ID: [name], female   DOB: August 30, 1985, 28 y.o.   MRN: [id number] D: Patient in dayroom on approach. Pt mood and affect appeared depressed and flat. Pt report feeling increasely depressed after the birth of her child. Pt reports unable to sleep for days. Pt denies SI/HI/AVH and pain. Pt attended evening wrap up group and engage in discussion.  Cooperative with assessment. No acute distressed noted at this time.   A: Met with pt 1:1. Medications administered as prescribed. Writer encouraged pt to discuss feelings. Pt encouraged to come to staff with any question or concerns.   R: Patient remains safe. She is complaint with medications and denies any adverse reaction.

## 2014-03-14 LAB — TSH: TSH: 2.59 u[IU]/mL (ref 0.350–4.500)

## 2014-03-14 NOTE — Plan of Care (Signed)
Problem: Ineffective individual coping Goal: STG: Patient will participate in after care plan Pt plans to return home at d/c and follow-up with psychiatrist/therapist. Pt also provided with Anger management info and resources per her request. Goal met. [place], [place] 03/13/2014 12:48 PM  Outcome: Completed/Met Date Met:  03/14/14 See [place] note. Goal is met.

## 2014-03-14 NOTE — Progress Notes (Signed)
Pt alert, oriented and cooperative. Affect/ mood sad but brightens on approach."I noticed I got up without being encouraged, I went to all groups and I feel better". -SI/HI,verbally contracts for safety. -A/[place]. Denies pain or discomfort. Emotional support and encouragement given. Will continue to monitor closely and evaluate for stabilization.

## 2014-03-14 NOTE — Progress Notes (Signed)
Pt stated that today was a good day. Her goal was to work on discharge planning.

## 2014-03-14 NOTE — [facility] Group Notes (Signed)
[facility] LCSW Group Therapy  03/14/2014 1:03 PM   Type of Therapy:  Group Therapy  Participation Level:  Active  Participation Quality:  Attentive  Affect:  Appropriate  Cognitive:  Appropriate  Insight:  Improving  Engagement in Therapy:  Engaged  Modes of Intervention:  Clarification, Education, Exploration and Socialization  Summary of Progress/Problems: Today's group focused on relapse prevention.  We defined the term, and then brainstormed on ways to prevent relapse.  "My relapse was not taking my meds regularly.  Going forward, I need to take care of myself by making sure I take my meds.  I don't want to end up here again, not that this is a bad place." [brought laughter]  Also talked about incorporating a therapist and Mental Health Association Groups into her recovery plan.  [name], [name] 03/14/2014 , 1:03 PM

## 2014-03-14 NOTE — Progress Notes (Signed)
Patient ID: [name], female   DOB: 12-11-1985, 28 y.o.   MRN: [id number]  D: Pt. Denies SI/HI and A/V Hallucinations to this Clinical research associatewriter. Patient rates her depression and anxiety at 2/10 for the day and hopelessness at 0/10 for the day. Patient does not report any pain or discomfort at this time. Patient reports she slept good last night and that the sleep medication was helpful. Patient reports her appetite is fair, energy level is normal, and concentration level is good.  A: Support and encouragement provided to the patient to come to Clinical research associatewriter with questions or concerns. Scheduled medications administered to patient per physician's orders.  R: Patient is receptive and cooperative with staff and is attending groups. Patient can be seen in the milieu and is speaking with her peers. Patient reports her goal is to, "work on my anxiety and depression." Patient reports she will meet that goal by, "takeing my medication and attending groups." Q15 minute checks are maintained for safety.

## 2014-03-14 NOTE — Plan of Care (Signed)
Problem: Alteration in mood Goal: STG-Patient is able to discuss feelings and issues (Patient is able to discuss feelings and issues leading to depression)  Outcome: Progressing Pt reports feeling a lot better  and glad daughter visited today.

## 2014-03-14 NOTE — [facility] Group Notes (Signed)
[facility] Group Notes:  (Nursing/MHT/Case Management/Adjunct)  Date:  03/14/2014  Time:  11:17 AM  Type of Therapy:  Nurse Education  Participation Level:  Active  Participation Quality:  Appropriate  Affect:  Blunted  Cognitive:  Appropriate  Insight:  Improving  Engagement in Group:  Engaged  Modes of Intervention:  Discussion and Education  Summary of Progress/Problems:  The purpose of this group is to focus on leisure and lifestyle changes that patient's can make for themselves. Alos, patient's are asked to think of a goal for themselves either short term or a longer term goal. [name] reports that she plans to take her medications, go to peer support groups, and attend anger management classes.  [name], [name] E 03/14/2014, 11:17 AM

## 2014-03-14 NOTE — Progress Notes (Signed)
Patient ID: [name], female   DOB: [birth year], 28 y.o.   MRN: [id number] D: Patient in dayroom on approach. Pt stated "I feel much better today".  Pt reports she had the chance to see her daughter and that made her day. Pt denies SI/HI/AVH and pain. Pt attended evening wrap up group and engage in discussion.  Cooperative with assessment. No acute distressed noted at this time.   A: Met with pt 1:1. Medications administered as prescribed. Writer encouraged pt to discuss feelings. Pt advised to continue prescribed medications after discharge. Pt encouraged to come to staff with any question or concerns.   R: Patient remains safe. She is complaint with medications and denies any adverse reaction.

## 2014-03-14 NOTE — Progress Notes (Signed)
[facility]  03/14/2014 2:37 PM [name]  MRN:  [id number] Subjective: Patient states,"I am doing better,I feel the medications are working for me". Objective:Patient seen and chart reviewed. Patient is postpartum 7 weeks ,presented with increased depressive sx as well as inability to take care of her new born baby.Pt today appears to be more calm ,less depressed and less anxious. Reports medications as working for her. Pt was happy to see her baby yesterday and is working with her family to find a way to  Manage things better at home. Patient reports sleep as fair and appetite as improving. Patient denies SI/HI/AH/VH. Patient is compliant on medications. Patient denies any side effects.     Diagnosis:   DSM5: DSM5: Primary Psychiatric Diagnosis: Major depressive disorder,single episode ,moderate with peripartum onset    Non Psychiatric Diagnosis: Postpartum- 7 weeks    Total Time spent with patient: 30 minutes   ADL's:  Intact  Sleep: Fair  Appetite:  Fair   Psychiatric Specialty Exam: Physical Exam  ROS  Blood pressure 122/85, pulse 83, temperature 98.5 F (36.9 C), temperature source Oral, resp. rate 20, height 5' 0.5" (1.537 m), weight 67.586 kg (149 lb), last menstrual period 04/17/2013.Body mass index is 28.61 kg/(m^2).  General Appearance: Casual  Eye Contact::  Good  Speech:  Clear and Coherent  Volume:  Normal  Mood:  Anxious and Depressed improving  Affect:  Appropriate  Thought Process:  Goal Directed  Orientation:  Full (Time, Place, and Person)  Thought Content:  WDL  Suicidal Thoughts:  No  Homicidal Thoughts:  No  Memory:  Immediate;   Fair Recent;   Good Remote;   Good  Judgement:  Fair  Insight:  Fair  Psychomotor Activity:  Normal  Concentration:  Good  Recall:  Good  Fund of Knowledge:Good  Language: Good  Akathisia:  No  Handed:  Right  AIMS (if indicated):     Assets:  Communication Skills Desire for  Improvement Financial Resources/Insurance Housing Intimacy Physical Health Social Support Talents/Skills Vocational/Educational  Sleep:  Number of Hours: 6.5   Musculoskeletal: Strength & Muscle Tone: within normal limits Gait & Station: normal Patient leans: N/A  Current Medications: Current Facility-Administered Medications  Medication Dose Route Frequency Provider Last Rate Last Dose  . acetaminophen (TYLENOL) tablet 650 mg  650 mg Oral Q6H PRN Kerry HoughSpencer E Simon, PA-C      . alum & mag hydroxide-simeth (MAALOX/MYLANTA) 200-200-20 MG/5ML suspension 30 mL  30 mL Oral Q4H PRN Kerry HoughSpencer E Simon, PA-C      . busPIRone (BUSPAR) tablet 7.5 mg  7.5 mg Oral TID Jomarie LongsSaramma Jenaveve Fenstermaker, MD   7.5 mg at 03/14/14 1140  . calcium carbonate (OS-CAL - dosed in mg of elemental calcium) tablet 1,250 mg  1,250 mg Oral Daily Kerry HoughSpencer E Simon, PA-C   1,250 mg at 03/14/14 0754  . magnesium hydroxide (MILK OF MAGNESIA) suspension 30 mL  30 mL Oral Daily PRN Kerry HoughSpencer E Simon, PA-C      . norgestimate-ethinyl estradiol (ORTHO-CYCLEN,SPRINTEC,PREVIFEM) 0.25-35 MG-MCG tablet 1 tablet  1 tablet Oral Daily Kerry HoughSpencer E Simon, PA-C   1 tablet at 03/13/14 [id number]  . traZODone (DESYREL) tablet 50 mg  50 mg Oral QHS,MR X 1 Kerry HoughSpencer E Simon, PA-C   50 mg at 03/13/14 2112  . venlafaxine XR (EFFEXOR-XR) 24 hr capsule 75 mg  75 mg Oral Q breakfast Jomarie LongsSaramma Karolina Zamor, MD   75 mg at 03/14/14 0754    Lab Results:  Results for  orders placed or performed during the hospital encounter of 03/12/14 (from the past 48 hour(s))  TSH     Status: None   Collection Time: 03/14/14  6:33 AM  Result Value Ref Range   TSH 2.590 0.350 - 4.500 uIU/mL    Comment: [facility]    Physical Findings: AIMS:  , ,  ,  ,    CIWA:    COWS:     Treatment Plan Summary: Daily contact with patient to assess and evaluate symptoms and progress in treatment Medication management  Plan:  Reviewed ED notes and collateral information from  family. Will continue Effexor XR 75 mg po daily. Will continue Buspar 7.5 mg po tid for anxiety sx during the day. Will continue Trazodone 50 mg po qhs for sleep.  Will continue to monitor vitals ,medication compliance and treatment side effects while patient is here.  Will monitor for medical issues as well as call consult as needed.  Reviewed labs ,TSH -wnl (03/13/14) CSW will start working on disposition.  Patient to participate in therapeutic milieu .   Medical Decision Making Problem Points:  Established problem, stable/improving (1), Review of last therapy session (1) and Review of psycho-social stressors (1) Data Points:  Review or order medicine tests (1) Review of medication regiment & side effects (2)  I certify that inpatient services furnished can reasonably be expected to improve the patient's condition.   Kristy Wegener MD 03/14/2014, 2:37 PM

## 2014-03-15 NOTE — [facility] Group Notes (Signed)
[facility]   03/15/2014 10:41 AM  Participation Quality:  Appropriate   Mood/Affect:  Appropriate  Depression Rating:  0  Anxiety Rating:  10-"because of issues with another pt" pt assured that MD/staff were aware and were working to make sure she does not experience further issues with pt.   Thoughts of Suicide:  No Will you contract for safety?   NA  Current AVH:  No  Plan for Discharge/Comments:  Pt to d/c tomorrow after lunch. CSW working to find psychiatrist/[place] in [place] or [place] specializing in Post Partum depression. Anger management group info provided to pt (mental health association) per her request.   Transportation Means: husband   Supports: parents and husband   Smart, OncologistHeather LCSWA

## 2014-03-15 NOTE — Progress Notes (Signed)
NUTRITION ASSESSMENT  Consult for patient 7 weeks post partum and hx of gastric bypass surgery per consult (date of surgery unknown)  INTERVENTION: Discussed with RN.   Patient to choose meals and snacks.  Recommendations for post bariatric are low fat and no concentrated sweets.  Patient will choose foods tolerated.  Can continue regular diet with patient choice. Will provide high protein snacks tid along with meals.  NUTRITION DIAGNOSIS: Increased nutrient needs related to high protein needs post bariatric surgery.  Goal: Pt to meet >/= 90% of their estimated nutrition needs.  Monitor:  PO intake  Assessment:  Patient admitted with post partum depression.  S/p bariatric surgery per consult with unknown date of surgery.  Consult completed remotely.  Discussed with RN.  28 y.o. female  Height: Ht Readings from Last 1 Encounters:  03/12/14 5' 0.5" (1.537 m)    Weight: Wt Readings from Last 1 Encounters:  03/12/14 149 lb (67.586 kg)    Weight Hx: Wt Readings from Last 10 Encounters:  03/12/14 149 lb (67.586 kg)  03/04/14 148 lb 14.4 oz (67.541 kg)  01/14/14 167 lb (75.751 kg)  01/03/14 165 lb (74.844 kg)  12/31/13 166 lb 4 oz (75.411 kg)  12/27/13 164 lb 4.8 oz (74.526 kg)  12/13/13 166 lb 6.4 oz (75.479 kg)  11/29/13 159 lb 11.2 oz (72.439 kg)  11/15/13 162 lb (73.483 kg)  10/11/13 153 lb (69.4 kg)    BMI:  Body mass index is 28.61 kg/(m^2). Pt meets criteria for overweight based on current BMI.  Estimated Nutritional Needs: Kcal: 25-30 kcal/kg Protein: > 1 gram protein/kg Fluid: 1 ml/kcal  Diet Order: Diet regular Pt is also offered choice of unit snacks mid-morning and mid-afternoon.  Pt is eating as desired.   Lab results and medications reviewed.   [name], RD, LDN Clinical Inpatient Dietitian Pager:  [phone] Weekend and after hours pager:  [phone]

## 2014-03-15 NOTE — Progress Notes (Signed)
[facility]  03/15/2014 1:28 PM [name]  MRN:  [id number] Subjective: Patient states,"I am doing better,I feel less anxious ,but there is this other pt who makes me anxious by coming in to my room." Objective:Patient seen and chart reviewed. Patient is postpartum 7 weeks ,presented with increased depressive sx as well as inability to take care of her new born baby.Pt today appears to be very calm and reports depression as well as anxiety as improving. Patient reports anxiety from another pt who walked in to her room and was very intrusive. Patient reports sleep as fair and appetite as improving. Patient denies SI/HI/AH/VH. Patient is compliant on medications. Patient denies any side effects. Pt to be discharged tomorrow ,if she continues to be stable. Pt agrees with the plan.     Diagnosis:   DSM5: Primary Psychiatric Diagnosis: Major depressive disorder,single episode ,moderate with peripartum onset    Non Psychiatric Diagnosis: Postpartum- 7 weeks    Total Time spent with patient: 30 minutes   ADL's:  Intact  Sleep: Fair  Appetite:  Fair   Psychiatric Specialty Exam: Physical Exam  ROS  Blood pressure 130/92, pulse 72, temperature 97.5 F (36.4 C), temperature source Oral, resp. rate 18, height 5' 0.5" (1.537 m), weight 67.586 kg (149 lb), last menstrual period 04/17/2013.Body mass index is 28.61 kg/(m^2).  General Appearance: Casual  Eye Contact::  Good  Speech:  Clear and Coherent  Volume:  Normal  Mood:  Anxious and Depressed improving  Affect:  Appropriate  Thought Process:  Goal Directed  Orientation:  Full (Time, Place, and Person)  Thought Content:  WDL  Suicidal Thoughts:  No  Homicidal Thoughts:  No  Memory:  Immediate;   Fair Recent;   Good Remote;   Good  Judgement:  Fair  Insight:  Fair  Psychomotor Activity:  Normal  Concentration:  Good  Recall:  Good  Fund of Knowledge:Good  Language: Good  Akathisia:  No   Handed:  Right  AIMS (if indicated):     Assets:  Communication Skills Desire for Improvement Financial Resources/Insurance Housing Intimacy Physical Health Social Support Talents/Skills Vocational/Educational  Sleep:  Number of Hours: 6.75   Musculoskeletal: Strength & Muscle Tone: within normal limits Gait & Station: normal Patient leans: N/A  Current Medications: Current Facility-Administered Medications  Medication Dose Route Frequency Provider Last Rate Last Dose  . acetaminophen (TYLENOL) tablet 650 mg  650 mg Oral Q6H PRN Kerry HoughSpencer E Simon, PA-C      . alum & mag hydroxide-simeth (MAALOX/MYLANTA) 200-200-20 MG/5ML suspension 30 mL  30 mL Oral Q4H PRN Kerry HoughSpencer E Simon, PA-C      . busPIRone (BUSPAR) tablet 7.5 mg  7.5 mg Oral TID Jomarie LongsSaramma Bell Carbo, MD   7.5 mg at 03/15/14 1159  . calcium carbonate (OS-CAL - dosed in mg of elemental calcium) tablet 1,250 mg  1,250 mg Oral Daily Kerry HoughSpencer E Simon, PA-C   1,250 mg at 03/15/14 0809  . magnesium hydroxide (MILK OF MAGNESIA) suspension 30 mL  30 mL Oral Daily PRN Kerry HoughSpencer E Simon, PA-C      . norgestimate-ethinyl estradiol (ORTHO-CYCLEN,SPRINTEC,PREVIFEM) 0.25-35 MG-MCG tablet 1 tablet  1 tablet Oral Daily Kerry HoughSpencer E Simon, PA-C   1 tablet at 03/13/14 [id number]  . traZODone (DESYREL) tablet 50 mg  50 mg Oral QHS,MR X 1 Kerry HoughSpencer E Simon, PA-C   50 mg at 03/14/14 2113  . venlafaxine XR (EFFEXOR-XR) 24 hr capsule 75 mg  75 mg Oral Q breakfast Ayomikun Starling,  MD   75 mg at 03/15/14 [id number]    Lab Results:  Results for orders placed or performed during the hospital encounter of 03/12/14 (from the past 48 hour(s))  TSH     Status: None   Collection Time: 03/14/14  6:33 AM  Result Value Ref Range   TSH 2.590 0.350 - 4.500 uIU/mL    Comment: Performed at [facility]    Physical Findings: AIMS: Facial and Oral Movements Muscles of Facial Expression: None, normal Lips and Perioral Area: None, normal Jaw: None, normal Tongue: None,  normal,Extremity Movements Upper (arms, wrists, hands, fingers): None, normal Lower (legs, knees, ankles, toes): None, normal, Trunk Movements Neck, shoulders, hips: None, normal, Overall Severity Severity of abnormal movements (highest score from questions above): None, normal Incapacitation due to abnormal movements: None, normal Patient's awareness of abnormal movements (rate only patient's report): No Awareness, Dental Status Current problems with teeth and/or dentures?: No Does patient usually wear dentures?: No  CIWA:    COWS:     Treatment Plan Summary: Daily contact with patient to assess and evaluate symptoms and progress in treatment Medication management  Plan:  Reviewed ED notes and collateral information from family. Will continue Effexor XR 75 mg po daily. Will continue Buspar 7.5 mg po tid for anxiety sx during the day. Will continue Trazodone 50 mg po qhs for sleep. Will consult dietician -since pt is postpartum as well as S/P Bariatric surgery per hx.  Will continue to monitor vitals ,medication compliance and treatment side effects while patient is here.  Will monitor for medical issues as well as call consult as needed.    CSW will start working on disposition. Patient to be discharged on Saturday. Patient to participate in therapeutic milieu .   Medical Decision Making Problem Points:  Established problem, stable/improving (1), Review of last therapy session (1) and Review of psycho-social stressors (1) Data Points:  Review of medication regiment & side effects (2)  I certify that inpatient services furnished can reasonably be expected to improve the patient's condition.   Kristy Kutzer MD 03/15/2014, 1:28 PM

## 2014-03-15 NOTE — [facility] Suicide Risk Assessment (Signed)
[facility] INPATIENT: Family/Significant Other Suicide Prevention Education .   Pt did not c/o SI at admission, nor have they endorsed SI during their stay here. SPE not required. SPI pamphlet provided to pt and they were encouraged to share information with support network, ask questions, and talk about any concerns relating to SPE.  The Sherwin-WilliamsHeather Smart, LCSWA 03/15/2014 4:19 PM

## 2014-03-15 NOTE — Progress Notes (Signed)
[facility] :  Will you be returning to the same living situation after discharge: Yes,  home with husband At discharge, do you have transportation home?:Yes,  husband coming after lunch on SAT 11/14 Do you have the ability to pay for your medications:Yes,  [employer] private insurance  Release of information consent forms completed and submitted by CSW to Medical Records.   Patient to Follow up at: Follow-up Information    [facility] On [facility]15/2014.   Why:  Appt with Dr. Zonia KiefStephens at 9:30AM. If you need sooner appt, please contact the [facility] at [phone] (she can see you at this office as well).    Contact information:   ATTN: Dr. Zonia KiefStephens [address] [place], [place] [address] Phone: [phone] Fax: [phone]       Follow up with [facility]-Medication management and therapy.   Why:  Call on Monday morning to schedule appt for med managment and therapy. They are accepting new patients and do not require deposit but do require credit card number on file.    Contact information:   [address] [place], [place] [address] Phone: [phone] Fax: [phone]      Patient denies SI/HI:   Yes,  during group/self report.    Safety Planning and Suicide Prevention discussed:  Yes,  SPE not required as pt does not endorse SI during admission or [facility]. SPI pamphlet provided to pt and she was encouraged to share information with support network, ask questions, and talk about any concens relating to [employer]>  Smart, Nickie Warwick LCSWA  03/15/2014, 4:13 PM

## 2014-03-15 NOTE — Clinical Social Work Note (Signed)
CSW spoke with pt's mother regarding pt's aftercare plan. Information reviewed regarding anger management/postpartum depression "feelings after birth" support group, and CSW informed pt's mother that we are working on finding psychiatrist/therapist specializing in PPD for pt. [place] [facility] [place] [facility] [place], as [place] be [place] in services.  The Sherwin-WilliamsHeather Smart, LCSWA  03/15/2014 1:11 PM

## 2014-03-15 NOTE — Progress Notes (Signed)
[facility] Group Notes:  (Nursing/MHT/Case Management/Adjunct)  Date:  03/15/2014  Time:  10:25 PM  Type of Therapy:  Psychoeducational Skills  Participation Level:  Minimal  Participation Quality:  Inattentive  Affect:  Appropriate  Cognitive:  Appropriate  Insight:  Appropriate  Engagement in Group:  Lacking  Modes of Intervention:  Education  Summary of Progress/Problems: The patient revealed in group that she had a good day overall and that she will be discharged tomorrow according to her doctor.  The patient attempted to lay on the couch during group and was not interested in the group. When asked if she had learned anything from her stay in the hospital she initially  said no and then revised her answer to indicate that she learned from the groups. In terms of the theme for the day, her coping skill will be comprised of attending groups.   Hazle CocaGOODMAN, Rudolph Daoust S 03/15/2014, 10:25 PM

## 2014-03-15 NOTE — [facility] Group Notes (Signed)
[facility] LCSW Group Therapy  03/15/2014 3:03 PM  Type of Therapy:  Group Therapy  Participation Level:  Active  Participation Quality:  Attentive  Affect:  Appropriate  Cognitive:  Alert and Oriented  Insight:  Engaged  Engagement in Therapy:  Engaged  Modes of Intervention:  Confrontation, Discussion, Education, Exploration, Problem-solving, Rapport Building, Socialization and Support  Summary of Progress/Problems: Today's Topic: Hope-Group members were asked to explore the concept of Hope. Group members were challenged to identify what hope means to them, reflect on past experiences with hope, and explore what makes them feel hopeful/regain a sense of hope. [name] was attentive and engaged during today's processing group. She shared that to her, remembering that "tomorrow is another day" is hopeful to her. [name] shared that this reminder helps her to keep in mind that nothing is permanent, even bad days. [name] participated in group activity where she was asked to pick a picture that represented hope to her. [name] chose a picture of a starfish and stated "it reminds me of simpler times in my childhood and also reminds me that I need to forget the past and negative things and move forward because there is hope."    Smart, [name] LCSWA  03/15/2014, 3:03 PM

## 2014-03-15 NOTE — Progress Notes (Signed)
Patient ID: [name], female   DOB: 30-May-1985, 28 y.o.   MRN: [id number] D.  Pt presents with depressed mood, affect congruent. [name] reports that she is '' a little anxious this morning. One of the other patients has been bothering me. '' She states ''but the medicines are helping me. I've never been depressed like this in my life or had any problems like this but I can see that I needed help. '' She reports ''i am going to have my husband help me more with the baby. '' She completed self inventory and rates her anxiety at 4/10 on scale, 10 being worst. She has been attending unit programming. A. Medications given as ordered. Support and encouragement provided. Discussed patients progress with MD/treatment team. R. Patient is safe, in no acute distress at this time. No further voiced concerns at this time. Will continue to monitor q 15 minutes for safety .

## 2014-03-16 DIAGNOSIS — F53 Puerperal psychosis: Principal | ICD-10-CM

## 2014-03-16 MED ORDER — BUSPIRONE HCL 7.5 MG PO TABS: 7.5000 mg | ORAL_TABLET | Freq: Three times a day (TID) | ORAL | Status: AC

## 2014-03-16 MED ORDER — CALCIUM CARBONATE ANTACID 500 MG PO CHEW: 1.0000 | CHEWABLE_TABLET | Freq: Every day | ORAL | Status: AC

## 2014-03-16 MED ORDER — NORGESTIMATE-ETH ESTRADIOL 0.25-35 MG-MCG PO TABS: 1.0000 | ORAL_TABLET | Freq: Every day | ORAL | Status: AC

## 2014-03-16 MED ORDER — TRAZODONE HCL 50 MG PO TABS: 50.0000 mg | ORAL_TABLET | Freq: Every evening | ORAL | Status: AC | PRN

## 2014-03-16 MED ORDER — VENLAFAXINE HCL ER 75 MG PO CP24: 75.0000 mg | ORAL_CAPSULE | Freq: Every day | ORAL | Status: AC

## 2014-03-16 MED ORDER — PRENATAL 27-0.8 MG PO TABS: 1.0000 | ORAL_TABLET | Freq: Every day | ORAL | Status: AC

## 2014-03-16 MED ORDER — TRAZODONE HCL 50 MG PO TABS
50.0000 mg | ORAL_TABLET | Freq: Every evening | ORAL | Status: DC | PRN
  Filled 2014-03-16: qty 7

## 2014-03-16 NOTE — Plan of Care (Signed)
Problem: Ineffective individual coping Goal: STG: Patient will remain free from self harm Outcome: Progressing Pt has not harmed herself this shift.  She agreed to notify staff of any thoughts of self-harm.    Problem: Alteration in mood Goal: LTG-Patient reports reduction in suicidal thoughts (Patient reports reduction in suicidal thoughts and is able to verbalize a safety plan for whenever patient is feeling suicidal)  Outcome: Progressing Pt denied SI this shift.
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
## 2014-03-16 NOTE — Progress Notes (Signed)
MD completed DC order in chart as well as DC SRA. Pt completed  Morning assessment this morning and on it she wrote she denied SI within the past 24 hrs, she rated her depression, hopelessness and anxiety "0/0/2" and she stated " I'm sooooo ready to go home today". DC AVS reviewed with her by LS RN. Pt given all belongings previously locked up and pt stated " I will go to the dr when I go home". Affect is bight, eye contact is good and thought content is logical and goal - oriented. Pt escorted to bldg entrance by LS and dc'd to home.

## 2014-03-16 NOTE — [facility] Group Notes (Signed)
[facility] LCSW Group Therapy  03/16/2014 3:17 PM  Type of Therapy:  Group Therapy  Participation Level:  Active  Participation Quality:  Appropriate  Affect:  Appropriate  Cognitive:  Appropriate  Insight:  Developing/Improving, Engaged and Supportive  Engagement in Therapy:  Developing/Improving, Engaged and Supportive  Modes of Intervention:  Discussion, Education, Exploration, Rapport Building and Support  Summary of Progress/Problems: Pt was able to speak about support networks during group, however there was other pt's that were getting off topic often during group. Pt expressed understanding of support networks, positive/negative supports, as well as professional and personal supports.   Seabron SpatesVaughn, [name] 03/16/2014, 3:17 PM

## 2014-03-16 NOTE — [facility] Suicide Risk Assessment (Signed)
   Demographic Factors:  Adolescent or young adult and Caucasian  Total Time spent with patient: 30 minutes  Psychiatric Specialty Exam: Physical Exam  Psychiatric: Her speech is normal and behavior is normal. Judgment and thought content normal. Cognition and memory are normal. She exhibits a depressed mood.    ROS  Blood pressure 110/73, pulse 76, temperature 98.1 F (36.7 C), temperature source Oral, resp. rate 16, height 5' 0.5" (1.537 m), weight 67.586 kg (149 lb), last menstrual period 04/17/2013.Body mass index is 28.61 kg/(m^2).  General Appearance: Fairly Groomed  Patent attorneyye Contact::  Good  Speech:  Clear and Coherent and Normal Rate  Volume:  Normal  Mood:  Depressed  Affect:  Full Range  Thought Process:  Goal Directed, Linear and Logical  Orientation:  Full (Time, Place, and Person)  Thought Content:  Negative  Suicidal Thoughts:  No  Homicidal Thoughts:  No  Memory:  Immediate;   Good Recent;   Good Remote;   Good  Judgement:  Good  Insight:  Good  Psychomotor Activity:  Normal  Concentration:  Good  Recall:  Good  Fund of Knowledge:Good  Language: Good  Akathisia:  No  Handed:  Right  AIMS (if indicated):     Assets:  Communication Skills Desire for Improvement Financial Resources/Insurance Housing Intimacy Leisure Time Physical Health Resilience Social Support Talents/Skills Transportation Vocational/Educational  Sleep:  Number of Hours: 6.75    Musculoskeletal: Strength & Muscle Tone: within normal limits Gait & Station: normal Patient leans: N/A   Mental Status Per Nursing Assessment::   On Admission:  NA  Current Mental Status by Physician: NA  Loss Factors: NA  Historical Factors: Family history of mental illness or substance abuse  Risk Reduction Factors:   Responsible for children under 27 years of age, Sense of responsibility to family, Employed, Living with another person, especially a relative and Positive social  support  Continued Clinical Symptoms:  Postpartum Depression  Cognitive Features That Contribute To Risk:  n/a  Suicide Risk:  Minimal: No identifiable suicidal ideation.  Patients presenting with no risk factors but with morbid ruminations; may be classified as minimal risk based on the severity of the depressive symptoms  Discharge Diagnoses:   AXIS I:  Depression, Post-Partum AXIS II:  Deferred AXIS III:   Past Medical History  Diagnosis Date  . Medical history non-contributory   . Yeast vaginitis    AXIS IV:  limited coping skills AXIS V:  51-60 moderate symptoms  Plan Of Care/Follow-up recommendations:  Activity:  as tolerated Diet:  normal Tests:  none Other:  f/up and take meds as prescribed  Is patient on multiple antipsychotic therapies at discharge:  No   Has Patient had three or more failed trials of antipsychotic monotherapy by history:  No  Recommended Plan for Multiple Antipsychotic Therapies: NA    [name] 03/16/2014, 9:01 AM

## 2014-03-16 NOTE — Progress Notes (Signed)
D: Pt has anxious affect and depressed mood.  Pt reports her goal for the day was "working on coping skills."  Pt denies SI/HI, denies hallucinations.  Pt interacts with staff and peers appropriately.  Pt attended evening group.   A: Medication administered per order.  Met with pt and offered support and encouragement.  Safety maintained.  PRN medication administered for sleep, see flowsheet. R: Pt was compliant with scheduled medication this shift.  She is in no distress.  Verbally contracts for safety.  Will continue to monitor and assess for safety.

## 2014-03-20 NOTE — Progress Notes (Signed)
Patient Discharge Instructions:  After Visit Summary (AVS):   Faxed to:  03/20/14 Discharge Summary Note:   Faxed to:  03/20/14 Psychiatric Admission Assessment Note:   Faxed to:  03/20/14 Suicide Risk Assessment - Discharge Assessment:   Faxed to:  03/20/14 Faxed/Sent to the Next Level Care provider:  03/20/14 Faxed to [facility] @ [phone] Faxed to [facility] @ [phone] Jerel[address] ReddenSheena E [place], 03/20/2014, 3:05 PM

## 2014-04-02 ENCOUNTER — Encounter: Payer: Self-pay | Admitting: Obstetrics and Gynecology

## 2014-04-17 ENCOUNTER — Other Ambulatory Visit (HOSPITAL_COMMUNITY): Payer: Self-pay | Admitting: Psychiatry

## 2014-04-28 ENCOUNTER — Other Ambulatory Visit (HOSPITAL_COMMUNITY): Payer: Self-pay | Admitting: Psychiatry

## 2014-06-07 ENCOUNTER — Encounter (HOSPITAL_COMMUNITY): Payer: Self-pay | Admitting: *Deleted
